# Patient Record
Sex: Female | Born: 1955 | Race: White | Hispanic: No | Marital: Married | Smoking: Current every day smoker
Health system: Southern US, Community
[De-identification: ages and names within clinical notes are randomized; demographics above are authoritative.]

## PROBLEM LIST (undated history)

## (undated) DIAGNOSIS — T7840XA Allergy, unspecified, initial encounter: Secondary | ICD-10-CM

## (undated) DIAGNOSIS — E785 Hyperlipidemia, unspecified: Secondary | ICD-10-CM

## (undated) DIAGNOSIS — G43909 Migraine, unspecified, not intractable, without status migrainosus: Secondary | ICD-10-CM

## (undated) DIAGNOSIS — F32A Depression, unspecified: Secondary | ICD-10-CM

## (undated) DIAGNOSIS — M549 Dorsalgia, unspecified: Secondary | ICD-10-CM

## (undated) DIAGNOSIS — F1010 Alcohol abuse, uncomplicated: Secondary | ICD-10-CM

## (undated) DIAGNOSIS — F329 Major depressive disorder, single episode, unspecified: Secondary | ICD-10-CM

## (undated) DIAGNOSIS — F339 Major depressive disorder, recurrent, unspecified: Secondary | ICD-10-CM

## (undated) DIAGNOSIS — F411 Generalized anxiety disorder: Secondary | ICD-10-CM

## (undated) HISTORY — DX: Hyperlipidemia, unspecified: E78.5

## (undated) HISTORY — DX: Depression, unspecified: F32.A

## (undated) HISTORY — PX: OTHER SURGICAL HISTORY: SHX169

## (undated) HISTORY — DX: Migraine, unspecified, not intractable, without status migrainosus: G43.909

## (undated) HISTORY — PX: TONSILLECTOMY AND ADENOIDECTOMY: SUR1326

## (undated) HISTORY — DX: Dorsalgia, unspecified: M54.9

## (undated) HISTORY — PX: LUMBAR DISC SURGERY: SHX700

## (undated) HISTORY — DX: Major depressive disorder, single episode, unspecified: F32.9

## (undated) HISTORY — DX: Alcohol abuse, uncomplicated: F10.10

## (undated) HISTORY — DX: Allergy, unspecified, initial encounter: T78.40XA

---

## 2006-04-18 ENCOUNTER — Ambulatory Visit (HOSPITAL_COMMUNITY): Admission: RE | Admit: 2006-04-18 | Discharge: 2006-04-18 | Payer: Self-pay | Admitting: Neurosurgery

## 2006-07-27 ENCOUNTER — Inpatient Hospital Stay (HOSPITAL_COMMUNITY): Admission: EM | Admit: 2006-07-27 | Discharge: 2006-07-28 | Payer: Self-pay | Admitting: Emergency Medicine

## 2006-07-27 ENCOUNTER — Encounter (INDEPENDENT_AMBULATORY_CARE_PROVIDER_SITE_OTHER): Payer: Self-pay | Admitting: Specialist

## 2006-11-19 HISTORY — PX: APPENDECTOMY: SHX54

## 2007-09-30 ENCOUNTER — Other Ambulatory Visit: Admission: RE | Admit: 2007-09-30 | Discharge: 2007-09-30 | Payer: Self-pay | Admitting: Family Medicine

## 2007-10-02 ENCOUNTER — Ambulatory Visit (HOSPITAL_COMMUNITY): Admission: RE | Admit: 2007-10-02 | Discharge: 2007-10-02 | Payer: Self-pay | Admitting: Family Medicine

## 2007-12-16 ENCOUNTER — Other Ambulatory Visit: Admission: RE | Admit: 2007-12-16 | Discharge: 2007-12-16 | Payer: Self-pay | Admitting: Family Medicine

## 2008-06-16 ENCOUNTER — Ambulatory Visit (HOSPITAL_COMMUNITY): Admission: RE | Admit: 2008-06-16 | Discharge: 2008-06-16 | Payer: Self-pay | Admitting: Neurosurgery

## 2009-05-16 ENCOUNTER — Inpatient Hospital Stay (HOSPITAL_COMMUNITY): Admission: RE | Admit: 2009-05-16 | Discharge: 2009-05-18 | Payer: Self-pay | Admitting: Neurosurgery

## 2009-11-19 HISTORY — PX: ANTERIOR FUSION CERVICAL SPINE: SUR626

## 2010-01-19 ENCOUNTER — Ambulatory Visit (HOSPITAL_COMMUNITY): Admission: RE | Admit: 2010-01-19 | Discharge: 2010-01-20 | Payer: Self-pay | Admitting: Neurosurgery

## 2010-05-07 ENCOUNTER — Emergency Department (HOSPITAL_COMMUNITY): Admission: EM | Admit: 2010-05-07 | Discharge: 2010-05-07 | Payer: Self-pay | Admitting: Emergency Medicine

## 2011-02-04 LAB — DIFFERENTIAL
Basophils Relative: 1 % (ref 0–1)
Lymphocytes Relative: 31 % (ref 12–46)
Monocytes Absolute: 0.4 10*3/uL (ref 0.1–1.0)
Neutrophils Relative %: 62 % (ref 43–77)

## 2011-02-04 LAB — CBC
Hemoglobin: 13.9 g/dL (ref 12.0–15.0)
Platelets: 142 10*3/uL — ABNORMAL LOW (ref 150–400)
RDW: 13.6 % (ref 11.5–15.5)

## 2011-02-04 LAB — BASIC METABOLIC PANEL
BUN: 8 mg/dL (ref 6–23)
Calcium: 9.6 mg/dL (ref 8.4–10.5)
Chloride: 107 mEq/L (ref 96–112)
Creatinine, Ser: 0.68 mg/dL (ref 0.4–1.2)
GFR calc Af Amer: >60 mL/min (ref >60)
Glucose, Bld: 93 mg/dL (ref 70–99)
Potassium: 4 mEq/L (ref 3.5–5.1)

## 2011-02-05 ENCOUNTER — Other Ambulatory Visit (HOSPITAL_COMMUNITY): Payer: Self-pay | Admitting: Neurosurgery

## 2011-02-05 DIAGNOSIS — M542 Cervicalgia: Secondary | ICD-10-CM

## 2011-02-05 DIAGNOSIS — M5416 Radiculopathy, lumbar region: Secondary | ICD-10-CM

## 2011-02-07 LAB — CBC
HCT: 44.1 % (ref 36.0–46.0)
MCHC: 34 g/dL (ref 30.0–36.0)
Platelets: 126 10*3/uL — ABNORMAL LOW (ref 150–400)
RBC: 4.65 MIL/uL (ref 3.87–5.11)
WBC: 7.5 10*3/uL (ref 4.0–10.5)

## 2011-02-07 LAB — SURGICAL PCR SCREEN: MRSA, PCR: NEGATIVE

## 2011-02-13 ENCOUNTER — Ambulatory Visit (HOSPITAL_COMMUNITY)
Admission: RE | Admit: 2011-02-13 | Discharge: 2011-02-13 | Disposition: A | Discharge status: Home / Self Care | Payer: 59 | Source: Ambulatory Visit | Attending: Neurosurgery | Admitting: Neurosurgery

## 2011-02-13 ENCOUNTER — Other Ambulatory Visit (HOSPITAL_COMMUNITY): Payer: Self-pay

## 2011-02-13 DIAGNOSIS — M542 Cervicalgia: Secondary | ICD-10-CM

## 2011-02-13 DIAGNOSIS — M5416 Radiculopathy, lumbar region: Secondary | ICD-10-CM

## 2011-02-13 DIAGNOSIS — IMO0002 Reserved for concepts with insufficient information to code with codable children: Secondary | ICD-10-CM | POA: Insufficient documentation

## 2011-02-13 DIAGNOSIS — Z981 Arthrodesis status: Secondary | ICD-10-CM | POA: Insufficient documentation

## 2011-02-13 DIAGNOSIS — M25559 Pain in unspecified hip: Secondary | ICD-10-CM | POA: Insufficient documentation

## 2011-02-13 DIAGNOSIS — R209 Unspecified disturbances of skin sensation: Secondary | ICD-10-CM | POA: Insufficient documentation

## 2011-02-13 DIAGNOSIS — R51 Headache: Secondary | ICD-10-CM | POA: Insufficient documentation

## 2011-02-13 MED ORDER — IOHEXOL 300 MG/ML  SOLN
10.0000 mL | Freq: Once | INTRAMUSCULAR | Status: AC | PRN
  Administered 2011-02-13: 10 mL via INTRATHECAL

## 2011-02-26 LAB — BASIC METABOLIC PANEL
CO2: 28 mEq/L (ref 19–32)
Chloride: 107 mEq/L (ref 96–112)
GFR calc Af Amer: >60 mL/min (ref >60)
Potassium: 3.5 mEq/L (ref 3.5–5.1)

## 2011-02-26 LAB — CBC
HCT: 36.1 % (ref 36.0–46.0)
Hemoglobin: 13 g/dL (ref 12.0–15.0)
Hemoglobin: 14 g/dL (ref 12.0–15.0)
MCV: 94.6 fL (ref 78.0–100.0)
Platelets: 167 10*3/uL (ref 150–400)
RBC: 3.82 MIL/uL — ABNORMAL LOW (ref 3.87–5.11)
RBC: 4.33 MIL/uL (ref 3.87–5.11)
RDW: 13.6 % (ref 11.5–15.5)
WBC: 6.7 10*3/uL (ref 4.0–10.5)
WBC: 9.3 10*3/uL (ref 4.0–10.5)

## 2011-02-26 LAB — TYPE AND SCREEN: ABO/RH(D): A POS

## 2011-04-03 NOTE — Op Note (Signed)
NAMELANEYA, GASAWAY NO.:  [id number]   MEDICAL RECORD NO.:  [id number]          PATIENT TYPE:  OIB   LOCATION:  3526                         FACILITY:  [facility]   PHYSICIAN:  Cristi Loron, M.D.DATE OF BIRTH:  1956/04/28   DATE OF PROCEDURE:  06/16/2008  DATE OF DISCHARGE:  06/16/2008                               OPERATIVE REPORT   BRIEF HISTORY:  The patient is a 51-year white female who I performed a  left L5-S1 diskectomy back in 2007.  The patient did very well, never  seen hr in a couple of years.  She unfortunately developed recurrent  back and leg pain and now she is worked up with a lumbar MRI which  demonstrated a recurrent herniated disk L5-S1 on the left.  The patient  failed nonsurgical management and therefore weighed the risks, benefits,  and alternatives of surgery and decided to proceed with a left L5-S1  redo diskectomy.   PREOPERATIVE DIAGNOSES:  Left L5-S1 recurrent herniated stenosis,  degenerative disease, lumbar radiculopathy such as myelopathy and  lumbago.   POSTOPERATIVE DIAGNOSES:  Left L5-S1 recurrent herniated stenosis,  degenerative disease, lumbar radiculopathy such as myelopathy and  lumbago.   PROCEDURE:  Redo left L5-S1 diskectomy using microdissection.   SURGEON:  Cristi Loron, MD   ASSISTANT:  Danae Orleans. Venetia Maxon, MD   ANESTHESIA:  General endotracheal.   ESTIMATED BLOOD LOSS:  50 mL.   SPECIMENS:  None.   DRAINS:  None.   COMPLICATIONS:  None.   DESCRIPTION OF PROCEDURE:  The patient was brought to the operating room  by the anesthesia team.  General endotracheal anesthesia was induced.  The patient was then turned to the prone position on the Wilson frame.  Her lumbosacral region was then prepared with Betadine scrub and  Betadine solution.  Sterile drapes were applied.  I then injected the  area to be incised with Marcaine with epinephrine solution and used a  scalpel to make a linear midline incision  over the L5-S1 interspace  through the patient's pre-existing surgical scar.  I then used  electrocautery to perform a left-sided subperiosteal dissection exposing  the left spinous process and lamina of L5 and upper sacrum.  We obtained  intraoperative radiograph to confirm our location.   I then inserted the McCullough retractor for exposure and we brought the  operative microscope into the field, and under its electromagnification  and illumination, we completed the microdissection/decompression.  I  used a high-speed drill to extend the patient's prior left L5 laminotomy  in a cephalad direction until I encountered some relatively non-swaged  down dura.  I then used microdissection to free up the thecal sac from  the epidural fibrosis.  We used a Kerrison punch to remove some of the  epidural fibrosis.  We identified the left S1 nerve root and performed a  foraminotomy about it.  I then used microdissection to free up the nerve  root and thecal sac from the underlying disk herniation.  We encountered  free fragments of disk herniation emanating from the left L5-S1  interspace.  Dr. Venetia Maxon  then gently retracted the nerve roots and thecal  sac medially.  This exposed more disk herniation at the disk space.  We  incised the annulus fibrosis and performed a partial intervertebral  diskectomy using the pituitary forceps and the Epstein curette.  After  we were satisfied with the intervertebral diskectomy, we used Acufex  tool to remove some posterior spondylosis and redundant posterior  longitudinal ligament from the endplates at L5-S1 further decompressing  the neural structures.  We then palpated along the ventral surface of  the thecal sac along the exit route of the left S1 nerve root and noted  that neural structure were well decompressed.  We obtained hemostasis  using bipolar electrocautery.  We irrigated the wound out with  bacitracin solution, removed the retractor, and then  reapproximated the  patient's thoracolumbar fascia with interrupted #1 Vicryl suture, the  subcutaneous tissue with interrupted 2-0 Vicryl suture, and skin with  Steri-Strips and Benzoin.  The wound was then coated with bacitracin  ointment.  A sterile dressing was applied.  The drapes were removed, and  the patient was subsequently returned to supine position where she was  extubated by the anesthesia team and transported to postanesthesia care  unit in stable condition.  All sponge, instrument, and needle counts  were correct at the end of this case.      Cristi Loron, M.D.  Electronically Signed     JDJ/MEDQ  D:  06/16/2008  T:  06/17/2008  Job:  [id number]

## 2011-04-03 NOTE — Op Note (Signed)
NAMECAIRO, LINGENFELTER NO.:  [id number]   MEDICAL RECORD NO.:  [id number]          PATIENT TYPE:  INP   LOCATION:  3004                         FACILITY:  [facility]   PHYSICIAN:  Cristi Loron, M.D.DATE OF BIRTH:  11-23-1955   DATE OF PROCEDURE:  05/16/2009  DATE OF DISCHARGE:                               OPERATIVE REPORT   BRIEF HISTORY:  The patient is a 55 year old white female who suffered 2  prior ruptured disk at L5-S1 and has undergone 2 diskectomies.  The  patient has developed recurrent back and left leg pain consistent with a  left S1 radiculopathy.  He had a work up of the lumbar MRI, which  demonstrated the patient to have another ruptured disk at L5-S1 on the  left.  We discussed the various treatment options including surgery.  The patient has weighed the risks, benefits, and alternatives of the  surgery and decided to proceed with a L5-S1 decompression,  instrumentation, and fusion.   PREOPERATIVE DIAGNOSES:  1. Recurrent L5-S1 herniated nucleus pulposus.  2. Degenerative disk disease.  3. Foraminal stenosis.   POSTOPERATIVE DIAGNOSES:  1. Recurrent L5-S1 herniated nucleus pulposus.  2. Degenerative disk disease.  3. Foraminal stenosis.   PROCEDURE:  Redo left L5 laminotomy to decompress the left L5 as well as  the left S1 nerve root (work was in addition if the work required due to  the interbody fusion because the patient's recurrent ruptured disk and  severe foraminal stenosis), L5-S1 transforaminal lumbar interbody fusion  with local morselized autograft bone and Actifuse bone graft extender;  insertion of L5-S1 interbody prosthesis (Capstone PEEK interbody  prosthesis); posterior nonsegmental instrumentation at L5-S1 with legacy  titanium pedicle screws and rods; L5-S1 posterolateral arthrodesis with  local morselized autograft bone and Vitoss bone graft extender.   SURGEON:  Cristi Loron, MD   ASSISTANT:  Danae Orleans. Venetia Maxon,  MD   ANESTHESIA:  General endotracheal.   ESTIMATED BLOOD LOSS:  200 mL.   SPECIMENS:  None.   DRAINS:  None.   COMPLICATIONS:  None.   DESCRIPTION OF PROCEDURE:  The patient was brought to the operating room  by the anesthesia team.  General endotracheal anesthesia was induced.  The patient was carefully turned into the prone position on Wilson  frame.  Her lumbosacral region was then prepared with Betadine, scrubbed  with Betadine solution.  Sterile drapes were applied.  I then injected  the area to be incised with Marcaine with epinephrine solution.  Used  scalpel to make a linear midline incision through the patient's previous  surgical scar at L5-S1.  We extended the incision in the cephalad and  caudal direction.  We used electrocautery performing bilateral  subperiosteal dissection exposing spinous process of lamina of L4-5 in  the upper sacrum.  We obtained intraoperative radiograph to confirm our  location and inserted the first retractor for exposure.   Because of the patient's recurrent ruptured disk and severe degeneration  at foraminal stenosis a wide lateral decompression was required on the  left to decompress the left L5 as well as S1 nerve root.  We began the  decompression by use of a high-speed drill to extend the patient's prior  L5 laminotomy in cephalad direction.  We drilled until we encountered  some relatively nonscarred dura.  We then carefully dissected through  the epidural fibrosis exposing underlying dura.  We widened laminotomy  with Kerrison punch and then performed wide foraminotomies about the  left L5-S1 nerve root completing almost completed facetectomy at L5-S1  on left.  We then carefully freed up the thecal sac and the S1 nerve  root from the scar tissue and then retracted it medially with D'Errico  retractor.  This exposed the recurrent ruptured disk.  We removed it in  multiple fragments using pituitary forceps.  We then incised into the   intervertebra.  This completed the decompression.   We incised into the L5-S1 intervertebral disk with 15-blade scalpel  performed a partial intervertebral section with a pituitary forceps.  The disk space was quite collapsed.  We removed degenerated fragments  off intervertebral disk.  We then prepared the vertebral endplates for  fusion by using curettes to remove the soft tissue from the retrieved  plates at G3-O7.  We then used trial spacers determined to use a 10 x 20  x 32 mm Capstone Peak interbody prosthesis.  We prefilled this  prosthesis with combination of local morselized autograft bone we  obtained on decompression as well as Actifuse bone graft extender.  We  then retracted the thecal sac and S1 nerve root medially inserted  prosthesis in the L5-S1 interspace.  We then used a tamp to turn it  laterally.  We got a good snug fit of the prosthesis interspace.  We  filled the remainder of disk space with a combination of local  morselized autograft bone and Actifuse bone graft extender.  This  completed the transforaminal lumbar interbody fusion.   We now turned attention to the instrumentation under fluoroscopic  guidance.  We cannulated bilateral L5-S1 pedicles with bone probe.  We  tapped the pedicles with a 5.5 mm tap.  We then removed the tap and  probed inside the tapped pedicles throughout cortical breeches.  We then  inserted 6.5 x 45 mm pedicle screws bilaterally at L5 and 6.5 x 40  bilaterally at S1.  We got good bony purchase.  We then connected  unilateral pedicle screws with a lordotic rod.  We compressed construct  and then secured the rod in place with the caps, which we tightened  appropriately completing the instrumentation.   We now turned attention to posterolateral arthrodesis.  We used high-  speed drill to corticate the remainder of the L5-S1 pars facet and  transverse processes.  We then laid a combination of local morselized  autograft bone and Vitoss  bone graft extender over these decorticated  posterior structures completing the post lateral arthrodesis.   We then inspected the thecal sac on the left at L5-S1 as well as left L5-  S1 nerve root and noted neural structure well decompressed.  We obtained  hemostasis using bipolar electrocautery irrigated the wound out with  bacitracin solution.  We then removed the retractors and then  reapproximated the patient's thoracolumbar fascia with interrupted #1  Vicryl suture, subcutaneous with 2-0 Vicryl suture and the skin with  Steri-Strips and Benzoin.  The  wound was then coated with bacitracin ointment.  Sterile dressing  applied.  The drapes were removed.  The patient was returned to supine  position where she was extubated by the  anesthesia team and transported  to post anesthesia care unit in stable condition.  All sponge,  instrument, and needle counts were correct at the end of the case.      Cristi Loron, M.D.  Electronically Signed     JDJ/MEDQ  D:  05/16/2009  T:  05/17/2009  Job:  [id number]

## 2011-04-06 NOTE — Op Note (Signed)
NAMEJODELL, WEITMAN NO.:  [id number]   MEDICAL RECORD NO.:  [id number]          PATIENT TYPE:  AMB   LOCATION:  SDS                          FACILITY:  [facility]   PHYSICIAN:  Cristi Loron, M.D.DATE OF BIRTH:  27-Jul-1956   DATE OF PROCEDURE:  04/18/2006  DATE OF DISCHARGE:                                 OPERATIVE REPORT   BRIEF HISTORY:  The patient is 55 year old white female who has suffered  from back and left leg pain consistent with left S1 radiculopathy.  She  failed medical management was worked up with a lumbar MRI which demonstrated  a large herniated disk L5-S1 the left.  I discussed the various treatment  options with her including surgery.  The patient weighed the risks, benefits  and alternatives of surgery and decided to proceed with left L5-S1  microdiskectomy.   PREOPERATIVE DIAGNOSIS:  Left L5-S1 herniated nucleus pulposus, spinal  stenosis, lumbar radiculopathy, lumbago.   POSTOPERATIVE DIAGNOSIS:  Left L5-S1 herniated nucleus pulposus, spinal  stenosis, lumbar radiculopathy, lumbago.   PROCEDURE:  Left L5-S1 microdiskectomy using microdissection.   SURGEON:  Cristi Loron, M.D.   ASSISTANT:  Hilda Lias, M.D.   ANESTHESIA:  General.   ESTIMATED BLOOD LOSS:  50 mL.   SPECIMENS:  None.   DRAINS:  None.   COMPLICATIONS:  None.   DESCRIPTION OF PROCEDURE:  The patient was brought to the operating room by  anesthesia team.  General endotracheal anesthesia was induced.  The patient  was then turned to the prone position on the Wilson frame.  Her lumbosacral  region was then prepared with Betadine scrub and Betadine solution.  Sterile  drapes were applied.  I then injected the area to be incised with Marcaine  with epinephrine solution, used a scalpel to make a linear midline incision  over the L5-S1 interspace.  I used electrocautery to perform a left-sided  subperiosteal dissection exposing the left spinous process and  lamina of L5  and the upper sacrum.  I then obtained an intraoperative radiograph to  confirm our location.   I then inserted the McCullough retractor for exposure and then brought the  operating microscope into the field.  Under the microscopes magnification  and illumination, completed microdissection/decompression.  I used high  speed drill to perform a left L5 laminotomy.  I widened the laminotomy with  Kerrison punches removing the L5-S1 ligamentum flavum.  I then performed a  foraminotomy about the left S1 nerve root.  I then used microdissection to  free up the nerve root from the epidural tissue and Dr. Jeral Fruit gently  retracted the nerve root medially with the D'Errico retractor.  This exposed  the underlying disk herniation with removed in multiple fragments using a  pituitary forceps.  I then inspected the L5-S1 intervertebral disk. There  was a hole in the annulus but there did not appear to be impending  herniations.  I therefore did not go into the intervertebral disk space.  I  then palpated along the ventral surface of the thecal sac, along the exit  route of the  left S1 nerve root and noted the neural structures well  decompressed.  I obtained stringent hemostasis using bipolar electrocautery.  I irrigated the wound out with bacitracin solution, then removed the  McCullough retractor.  I then reapproximated the patient's thoracolumbar  fascia with interrupted #1 Vicryl suture, the subcutaneous tissue with  interrupted 2-0 Vicryl suture and the skin with Steri-Strips and benzoin.  The wound was then coated with bacitracin ointment.  Sterile dressing  applied.  The drapes were removed.  The patient was subsequently turned to  supine position where she was extubated by anesthesia team and transported  to post anesthesia care unit in stable condition.  All sponge, needle and  instrument counts were correct at end of this case.      Cristi Loron, M.D.  Electronically  Signed     JDJ/MEDQ  D:  04/18/2006  T:  04/18/2006  Job:  [id number]

## 2011-04-06 NOTE — Op Note (Signed)
[name], Roach                ACCOUNT NO.:  [id number]   MEDICAL RECORD NO.:  [id number]          PATIENT TYPE:  INP   LOCATION:  1510                         FACILITY:  [facility]   PHYSICIAN:  Wilmon Arms. Corliss Skains, M.D. DATE OF BIRTH:  [birth year]   DATE OF PROCEDURE:  07/27/2006  DATE OF DISCHARGE:  07/28/2006                                 OPERATIVE REPORT   PREOPERATIVE DIAGNOSIS:  Acute appendicitis.   POSTOPERATIVE DIAGNOSIS:  Acute appendicitis.   PROCEDURE PERFORMED:  Laparoscopic appendectomy.   SURGEON:  Wilmon Arms. Tsuei, M.D.   ANESTHESIA:  General endotracheal.   INDICATIONS:  The patient is a 55 year old female who is in good health who  presented with 2 days of periumbilical pain migrating to her right lower  quadrant.  She presented to the emergency department where a CT scan  revealed evidence of acute appendicitis.  Surgery was consulted and we  recommended urgent laparoscopic appendectomy.  She was given preoperative  antibiotics and was brought to the operating room.   DESCRIPTION OF PROCEDURE:  The patient was brought to the operating room and  placed in the supine position on the operating room table.  After an  adequate level of general anesthesia was obtained, a Foley catheter was  placed under sterile technique.  The patient's abdomen was then prepped with  Betadine and draped in a sterile fashion.  A timeout was taken assure the  proper patient and proper procedure.  Her umbilicus was infiltrated with  0.25% Marcaine.  A vertical incision was made just below her umbilicus.  Dissection was carried down to the fascia, which was opened vertically.  The  peritoneal cavity was entered.  A pursestring suture of 0 Vicryl was placed  around the fascial opening.  The Hasson cannula was inserted and secured  with a stay suture.  Pneumoperitoneum was obtained by insufflating CO2 and  maintaining maximal pressure of 15 mmHg.  The laparoscope was inserted and  we  examined the right lower quadrant.  There were some chronic adhesions to  the anterior abdominal wall in the right lower quadrant.  The patient was  positioned in Trendelenburg position and tilted slightly to her left.  A 10  mm port was placed in the right upper quadrant.  A 5 mL port was placed in  the left lower quadrant.  The laparoscope was moved to the right upper  quadrant port site.  The adhesions were taken down with the harmonic  scalpel.  The cecum was mobilized medially and a mildly inflamed appendix  was identified.  Some inflammatory adhesions were taken down bluntly around  the base of the appendix.  The mesoappendix was then divided with the  harmonic scalpel.  The appendix then amputated with an Endo-GIA stapler.  The appendix was placed in an EndoCatch sac and removed through the  umbilical port site.  We then reinspected the right lower quadrant.  There  was a tiny oozing area just next to the staple line.  This was controlled  with pressure and small piece of  Surgicel.  We then irrigated the  pelvis.  No other bleeding or infection was noted.  The ports were removed under  direct vision as  pneumoperitoneum was released.  The fascial opening at the umbilicus was  closed with the pursestring suture.  4-0 Monocryl was used to close the  skin.  Steri-Strips and clean dressings were applied.  The patient was then  extubated and brought to the recovery in stable condition.  A Foley catheter  was removed prior to leaving the operating room.      Wilmon Arms. Tsuei, M.D.  Electronically Signed     MKT/MEDQ  D:  07/27/2006  T:  07/28/2006  Job:  [id number]   cc:   Alphonsus Sias  [address]  [place]  [place] [address]

## 2011-04-06 NOTE — H&P (Signed)
Leah Roach, Leah Roach                ACCOUNT NO.:  [id number]   MEDICAL RECORD NO.:  [id number]          PATIENT TYPE:  INP   LOCATION:  0104                         FACILITY:  [facility]   PHYSICIAN:  Wilmon Arms. Corliss Skains, M.D. DATE OF BIRTH:  03/13/1956   DATE OF ADMISSION:  07/27/2006  DATE OF DISCHARGE:                                HISTORY & PHYSICAL   CHIEF COMPLAINT:  Lower abdominal pain.   HISTORY OF PRESENT ILLNESS:  The patient is a 55 year old female in good  health who presents with a 2-day history of periumbilical abdominal pain,  constipation and decreased appetite.  She had one episode of vomiting 2 days  ago.  She denies any fever.  The pain had shifted more to her right side.  She presented to the emergency department, today, where the CT scan showed  evidence of acute appendicitis.   PAST MEDICAL HISTORY:  1. Former alcoholic.  2. Depression.   PAST SURGICAL HISTORY:  1. Lumbar diskectomy earlier in 2007.  2. A tonsillectomy as a child.   FAMILY HISTORY:  Several family members with gastric cancer.   SOCIAL HISTORY:  No alcohol use, but does smoke.   ALLERGIES:  None.   MEDICATIONS:  Paxil.   REVIEW OF SYSTEMS:  Otherwise negative.   PHYSICAL EXAMINATION:  VITAL SIGNS:  Afebrile, vital signs are stable.  GENERAL:  This is a well-developed, well-nourished female in no apparent  distress.  HEENT:  EOMI.  Sclerae anicteric.  NECK:  No masses, no thyromegaly.  LUNGS:  Clear to auscultation bilaterally.  Normal respiratory effort.  HEART:  Regular rate and rhythm.  No murmur.  ABDOMEN:  The patient has some periumbilical right lower quadrant  tenderness, nondistended.  No palpable masses.  No rebound, no guarding.  EXTREMITIES:  No edema.  SKIN: Warm and dry.  No sign of jaundice.   LABS:  White count 6.8, hemoglobin 12.7, platelet count 165.  Electrolytes  within normal limits.  Total bilirubin 0.4, lipase 24.  CT scan shows a  nonperforated inflamed  appendix.   IMPRESSION:  Acute appendicitis.   PLAN:  Recommend laparoscopic appendectomy.  We discussed the benefits and  risks of the procedure including the possible need for open procedure.  The  patient understands and wishes to proceed.      Wilmon Arms. Tsuei, M.D.  Electronically Signed     MKT/MEDQ  D:  07/27/2006  T:  07/27/2006  Job:  [id number]

## 2011-07-16 ENCOUNTER — Encounter: Payer: Self-pay | Admitting: Family Medicine

## 2011-07-16 ENCOUNTER — Ambulatory Visit (INDEPENDENT_AMBULATORY_CARE_PROVIDER_SITE_OTHER): Payer: 59 | Admitting: Family Medicine

## 2011-07-16 VITALS — BP 110/66 | HR 61 | Temp 98.1°F | Ht 66.0 in | Wt 125.0 lb

## 2011-07-16 DIAGNOSIS — M549 Dorsalgia, unspecified: Secondary | ICD-10-CM

## 2011-07-16 NOTE — Progress Notes (Signed)
Old records from pain clinic, neurosurg clinic, [facility], [facility] records and op notes reviewed.   New to est here.  Old h/o back pain s/p multiple surgeries.  Prev was disabled, is applying for long term status at this point.    Still with chronic pain in B shoulder girdle with weakness in hands, has been dropping objects unintentionally.  Chronic lower back pain, followed by pain clinic, s/p injection.  + h/o radicular sx. On chronic opiate therapy with partial relief of pain.   Pain exacerbated by certain movements, prolonged sitting.    PMH and SH reviewed  ROS: See HPI, otherwise noncontributory.  Meds, vitals, and allergies reviewed.   Nad, but uncomfortable Mmm ctab Dec in neck flex/ext/rotation, due to pain Upper T spine and lower C spine paraspinal muscles tender.  Upper L spine (at the midline) and lower L spine (paraspinal) muscles tender.  Weak grip x2, dec in sensation B 5th finger Symmetric weakness with arm abduction 3+ DTRs upper and patellar, dec in achilles B.  Dec in sensation L foot noted .

## 2011-07-16 NOTE — Patient Instructions (Signed)
I'll work on your papers and then we'll contact your.  Take care.

## 2011-07-17 ENCOUNTER — Encounter: Payer: Self-pay | Admitting: Family Medicine

## 2011-07-17 DIAGNOSIS — M549 Dorsalgia, unspecified: Secondary | ICD-10-CM | POA: Insufficient documentation

## 2011-07-17 NOTE — Assessment & Plan Note (Signed)
S/p diskectomy x2 with L and C spine fusion, followed by pain clinic.  Still with chronic pain requiring chronic opiate therapy.  It appears that she would be unable to work based on her current level of pain.  I will defer to pain clinic for mgmt of meds.  >60 min spent in total on case, ie with pt, reviewing records.

## 2011-08-17 LAB — BASIC METABOLIC PANEL
BUN: 15
CO2: 31
Chloride: 108
Creatinine, Ser: 0.7
Glucose, Bld: 88

## 2011-08-17 LAB — CBC
MCHC: 33.5
MCV: 93.7
Platelets: 162
RBC: 4.44
RDW: 13.1

## 2011-10-15 ENCOUNTER — Ambulatory Visit: Payer: 59 | Admitting: Family Medicine

## 2011-10-18 ENCOUNTER — Ambulatory Visit (INDEPENDENT_AMBULATORY_CARE_PROVIDER_SITE_OTHER): Payer: 59 | Admitting: Family Medicine

## 2011-10-18 ENCOUNTER — Encounter: Payer: Self-pay | Admitting: Family Medicine

## 2011-10-18 DIAGNOSIS — Z8249 Family history of ischemic heart disease and other diseases of the circulatory system: Secondary | ICD-10-CM

## 2011-10-18 DIAGNOSIS — R55 Syncope and collapse: Secondary | ICD-10-CM

## 2011-10-18 DIAGNOSIS — Z23 Encounter for immunization: Secondary | ICD-10-CM

## 2011-10-18 DIAGNOSIS — M549 Dorsalgia, unspecified: Secondary | ICD-10-CM

## 2011-10-18 DIAGNOSIS — R079 Chest pain, unspecified: Secondary | ICD-10-CM

## 2011-10-18 LAB — TSH: TSH: 1.08 u[IU]/mL (ref 0.35–5.50)

## 2011-10-18 LAB — CBC WITH DIFFERENTIAL/PLATELET
Basophils Absolute: 0 10*3/uL (ref 0.0–0.1)
HCT: 41.5 % (ref 36.0–46.0)
Hemoglobin: 13.9 g/dL (ref 12.0–15.0)
Lymphs Abs: 2 10*3/uL (ref 0.7–4.0)
Monocytes Relative: 3.7 % (ref 3.0–12.0)
Neutro Abs: 5.2 10*3/uL (ref 1.4–7.7)
RDW: 13.6 % (ref 11.5–14.6)

## 2011-10-18 LAB — COMPREHENSIVE METABOLIC PANEL
ALT: 24 U/L (ref 0–35)
CO2: 30 mEq/L (ref 19–32)
Creatinine, Ser: 0.8 mg/dL (ref 0.4–1.2)
GFR: 81.22 mL/min (ref >60.00)
Glucose, Bld: 93 mg/dL (ref 70–99)
Total Bilirubin: 0.3 mg/dL (ref 0.3–1.2)

## 2011-10-18 LAB — LIPID PANEL
Cholesterol: 253 mg/dL — ABNORMAL HIGH (ref 0–200)
Total CHOL/HDL Ratio: 3

## 2011-10-18 NOTE — Patient Instructions (Signed)
If you get profoundly short of breath, have more chest pain, or prolonged palpitations, then go to the ER.  See Shirlee Limerick about your referral before you leave today. You can get your results through our phone system.  Follow the instructions on the blue card. Take care.

## 2011-10-18 NOTE — Progress Notes (Signed)
Neck and back pain:  Constant "spider-web" feeling in the R shoulder and radicular pains on the medial>lateral side of L arm.  Still with radicular sx.  Numbness in L leg continues.  She's still dropping objects, her hands are "clumsy" at baseline.  Continued on pain meds through pain clinic.  She was seen at pain clinic 2 weeks ago (Dr. Vincent Peyer).  She's getting more positional numbness in her arms and hands.  She's going to see neurosurg in [place] soon.    She had an episode of syncope about 2 weeks ago.  She gets lightheaded occ.  Some occ chest heaviness and palpitations, can happen at rest, self resolves after about 1 minute.  No other syncope recently.  She did have a prev episode after a back surgery.  Fatigued and sweats occ noted.  No CP now.  Not SOB.   FH: Brother with CAD.    SH: Smoking 1 ppd.    Meds, vitals, and allergies reviewed.   ROS: See HPI.  Otherwise, noncontributory.  GEN: nad, alert and oriented HEENT: mucous membranes moist NECK:Dec in neck flex/ext/rotation, due to pain  Upper T spine and lower C spine paraspinal muscles tender.  Weak grip x2, as noted prev rrr ctab Ext w/o edema symm radial pulses  EGK reviewed.

## 2011-10-19 ENCOUNTER — Encounter: Payer: Self-pay | Admitting: Family Medicine

## 2011-10-19 DIAGNOSIS — R55 Syncope and collapse: Secondary | ICD-10-CM | POA: Insufficient documentation

## 2011-10-19 DIAGNOSIS — R079 Chest pain, unspecified: Secondary | ICD-10-CM | POA: Insufficient documentation

## 2011-10-19 LAB — LDL CHOLESTEROL, DIRECT: Direct LDL: 164.6 mg/dL

## 2011-10-19 NOTE — Assessment & Plan Note (Signed)
Per pain clinic with neurosurgery eval pending.

## 2011-10-19 NOTE — Assessment & Plan Note (Signed)
With her FH and smoking, I would like cards input.  Okay for outpatient f/u.  She requests [facility] cards.  D/w pt about stopping smoking. >25 min spent with face to face with patient, >50% counseling and/or coordinating care

## 2011-10-19 NOTE — Assessment & Plan Note (Signed)
Unclear source, but I would like cards input given her risk factors.  She agrees with plan.  See notes on labs when resulted.

## 2011-10-23 ENCOUNTER — Other Ambulatory Visit: Payer: Self-pay | Admitting: Family Medicine

## 2011-10-23 ENCOUNTER — Encounter: Payer: Self-pay | Admitting: Family Medicine

## 2011-10-23 DIAGNOSIS — F17200 Nicotine dependence, unspecified, uncomplicated: Secondary | ICD-10-CM | POA: Insufficient documentation

## 2011-10-23 DIAGNOSIS — IMO0001 Reserved for inherently not codable concepts without codable children: Secondary | ICD-10-CM | POA: Insufficient documentation

## 2011-10-23 DIAGNOSIS — E7800 Pure hypercholesterolemia, unspecified: Secondary | ICD-10-CM | POA: Insufficient documentation

## 2011-10-26 ENCOUNTER — Telehealth: Payer: Self-pay | Admitting: *Deleted

## 2011-10-26 ENCOUNTER — Other Ambulatory Visit: Payer: Self-pay | Admitting: *Deleted

## 2011-10-26 DIAGNOSIS — E7800 Pure hypercholesterolemia, unspecified: Secondary | ICD-10-CM

## 2011-10-26 MED ORDER — PRAVASTATIN SODIUM 20 MG PO TABS: 20.0000 mg | ORAL_TABLET | Freq: Every evening | ORAL | Status: DC

## 2011-10-26 NOTE — Telephone Encounter (Signed)
Patient scheduled 3 months follow up in February.  If she is able to tolerate the Pravastatin, she will need labs around that time.  She is requesting that we mail her an order for lab work and she can get it drawn at [facility] so that results will be in by her OV here.

## 2011-10-28 NOTE — Telephone Encounter (Signed)
The orders are in to be resulted through [facility].  Please mail her a copy of the lab order.  Let me know if that isn't going to work.  (this way it will show up in EMR as an order and not under the letters section).

## 2011-10-31 NOTE — Telephone Encounter (Signed)
Please write an order to be sent to the Patient. Not able to print the order in the computer without arriving it as being done today, Leah Roach

## 2011-11-02 NOTE — Telephone Encounter (Signed)
rx for cmet and lipid done, please give to pt.  Labs due ~12/21/11.  Orders in EMR removed. Thanks.

## 2011-11-02 NOTE — Telephone Encounter (Signed)
Order mailed to patient.

## 2012-01-02 ENCOUNTER — Encounter: Payer: Self-pay | Admitting: Family Medicine

## 2012-01-15 ENCOUNTER — Ambulatory Visit: Payer: 59 | Admitting: Family Medicine

## 2012-01-23 ENCOUNTER — Ambulatory Visit: Payer: 59 | Admitting: Family Medicine

## 2012-01-30 ENCOUNTER — Encounter: Payer: Self-pay | Admitting: Family Medicine

## 2012-01-30 ENCOUNTER — Ambulatory Visit (INDEPENDENT_AMBULATORY_CARE_PROVIDER_SITE_OTHER): Payer: 59 | Admitting: Family Medicine

## 2012-01-30 VITALS — BP 110/70 | HR 60 | Temp 98.3°F | Wt 129.2 lb

## 2012-01-30 DIAGNOSIS — F17200 Nicotine dependence, unspecified, uncomplicated: Secondary | ICD-10-CM

## 2012-01-30 DIAGNOSIS — E7800 Pure hypercholesterolemia, unspecified: Secondary | ICD-10-CM

## 2012-01-30 DIAGNOSIS — M549 Dorsalgia, unspecified: Secondary | ICD-10-CM

## 2012-01-30 DIAGNOSIS — F329 Major depressive disorder, single episode, unspecified: Secondary | ICD-10-CM

## 2012-01-30 NOTE — Patient Instructions (Signed)
Take care.  Don't change your meds and good luck with the smoking.

## 2012-01-30 NOTE — Progress Notes (Signed)
She's getting ready to have surgery/fusion at [facility] for C spine disease and then several months later for lumbar stenosis.  Persistent paresthesia in arms and legs, along with pain.    She had an episode of syncope prev and her eval at [facility] was rescheduled until she cancelled and then scheduled with another clinic, eval is pending.  No more palpitations, they gradually decreased, no other syncope.  She'll occ get light headed but isn't severe.  The syncope was around the time of a new med start for pain, and this episode could have been med related.    Her daughter continues to have anxiety and this is a source of stress.  Continues on prev meds.  Abstaining from etoh .  HLD: She was started on statin in meantime.  Lipids now at goal.   Using medications without problems:yes Muscle aches:  At baseline, not worse with meds Diet compliance:yes Exercise:limited by pain.  Other complaints: labs d/w pt.    Down to 1/2 PPD smoking.    PMH and SH reviewed  Meds, vitals, and allergies reviewed.   ROS: See HPI.  Otherwise negative.    GEN: nad, alert and oriented HEENT: mucous membranes moist NECK: supple w/o LA CV: rrr. PULM: ctab, no inc wob ABD: soft, +bs EXT: no edema

## 2012-01-31 ENCOUNTER — Encounter: Payer: Self-pay | Admitting: Family Medicine

## 2012-01-31 DIAGNOSIS — F32A Depression, unspecified: Secondary | ICD-10-CM | POA: Insufficient documentation

## 2012-01-31 DIAGNOSIS — F329 Major depressive disorder, single episode, unspecified: Secondary | ICD-10-CM | POA: Insufficient documentation

## 2012-01-31 NOTE — Assessment & Plan Note (Signed)
She's cutting back, planning on stopping.

## 2012-01-31 NOTE — Assessment & Plan Note (Signed)
With surgery pending at [facility]

## 2012-01-31 NOTE — Assessment & Plan Note (Signed)
Continue current meds 

## 2012-01-31 NOTE — Assessment & Plan Note (Signed)
Improved, continue current meds.  No ADE.

## 2012-04-07 ENCOUNTER — Telehealth: Payer: Self-pay | Admitting: Family Medicine

## 2012-04-07 NOTE — Telephone Encounter (Signed)
Case manager, Corrie Dandy, with [employer] called and said she had received the medical records from [place] for 3/13 and has information that patient is scheduled for next visit on 04/21/12 and would like to know if Dr. Para March is keeping the patient out of work.  Please return call to [phone]

## 2012-04-08 NOTE — Telephone Encounter (Signed)
Call them back.  Based on prev exams, it would appear that patient hasn't been a candidate to return to work.  At last OV, she was in the process of arranging neck surgery.

## 2012-04-08 NOTE — Telephone Encounter (Signed)
LMOVM (secure).

## 2012-04-21 ENCOUNTER — Ambulatory Visit: Payer: 59 | Admitting: Family Medicine

## 2012-04-28 ENCOUNTER — Ambulatory Visit: Payer: 59 | Admitting: Family Medicine

## 2012-05-02 ENCOUNTER — Ambulatory Visit: Payer: 59 | Admitting: Family Medicine

## 2012-05-12 ENCOUNTER — Ambulatory Visit (INDEPENDENT_AMBULATORY_CARE_PROVIDER_SITE_OTHER): Payer: 59 | Admitting: Family Medicine

## 2012-05-12 ENCOUNTER — Encounter: Payer: Self-pay | Admitting: Family Medicine

## 2012-05-12 VITALS — BP 118/80 | HR 75 | Temp 98.2°F | Wt 133.0 lb

## 2012-05-12 DIAGNOSIS — F17200 Nicotine dependence, unspecified, uncomplicated: Secondary | ICD-10-CM

## 2012-05-12 DIAGNOSIS — F329 Major depressive disorder, single episode, unspecified: Secondary | ICD-10-CM

## 2012-05-12 DIAGNOSIS — M549 Dorsalgia, unspecified: Secondary | ICD-10-CM

## 2012-05-12 DIAGNOSIS — E7800 Pure hypercholesterolemia, unspecified: Secondary | ICD-10-CM

## 2012-05-12 DIAGNOSIS — F3289 Other specified depressive episodes: Secondary | ICD-10-CM

## 2012-05-12 NOTE — Patient Instructions (Addendum)
Don't change your meds for now.   I would recheck in 2/14, sooner if needed.  Take care.  Good luck with the back surgery.  Recheck labs in 2/14 before the visit.

## 2012-05-12 NOTE — Assessment & Plan Note (Signed)
Discussed, she's working on quitting.

## 2012-05-12 NOTE — Assessment & Plan Note (Signed)
With f/u L spine surgery pending at [facility].  I will defer to surgery/pain clinic.

## 2012-05-12 NOTE — Assessment & Plan Note (Signed)
Continue current meds.  Stable mood.

## 2012-05-12 NOTE — Progress Notes (Signed)
S/p cervical spine fusion in 3/13.  Still with "a spider web feeling" in the R shoulder but pain is some better. She still has some weakness in the L hand.  She's dropped objects with L hand due to weakness. Continues on baseline pain meds.  Continued with lumbar spine pain, has surgery pending at [facility] in 05/2012.  Still with R >L leg radicular pain and numbness in L foot.  She isn't driving because her hands and feet will go numb.  Her disability has been approved through August.    "How is your mood?"  "Frustrated."  Her activities have been limited and this is tough for her.  She continues on SSRI with some help.  Sleep is okay.  She occ wakes at night from her back, with rolling over.  This affects her energy level during the day.    Smoking.  Discussed.  Was was temporarily off cigs and used IS after the surgery.  Down to less than 1/2 PPD now.  She's trying to quit.    Her daughter is showing some improvement in terms of her relationships with family.    Meds, vitals, and allergies reviewed.   ROS: See HPI.  Otherwise, noncontributory.  nad ncat Mmm rrr ctab Back w/o midline pain but L grip weak than R, L hip flexor weaker than R, and L foot with weaker dorsiflexion Speech and affect wnl

## 2012-05-12 NOTE — Assessment & Plan Note (Signed)
On statin.  Hand written order for cmet/lipids before visit in 2014.  [facility].

## 2012-10-27 ENCOUNTER — Other Ambulatory Visit: Payer: Self-pay | Admitting: Family Medicine

## 2013-01-12 IMAGING — RF DG MYELOGRAM 2+ REGIONS
12 of 19 series · 12 of 19 positions shown · non-contrast
Comparison: Intraoperative cervical spine lateral view 01/19/2010.

CLINICAL DATA: Low back pain.  Bilateral hip pain.  Leg pain
greater on the right.  Numbness left foot.  Mid posterior neck
pain.  Headaches.  Right shoulder and upper back tingling.

CERVICAL AND LUMBAR MYELOGRAM AND POST MYELOGRAM CT
MYELOGRAM CERVICAL
TECHNIQUE: Contrast was injected by Dr.Pinna the L3-4  level.
Contrast was negotiated into the cervical spine without difficulty.
Fluoroscopy time:  0.56 minutes
TECHNIQUE: Multidetector CT imaging of the cervical spine was
performed following myelography.  Multiplanar CT image
reconstructions were also generated.
TECHNIQUE: Multidetector CT imaging of the lumbar spine was

[Series 1: run · 1 of 1 slices shown (1 of 12)]
[im 1/1]
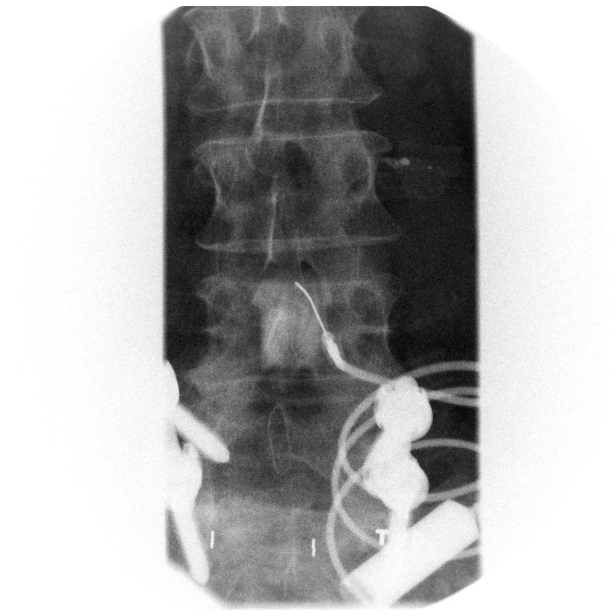

[Series 3: run · 1 of 1 slices shown (2 of 12)]
[im 1/1]
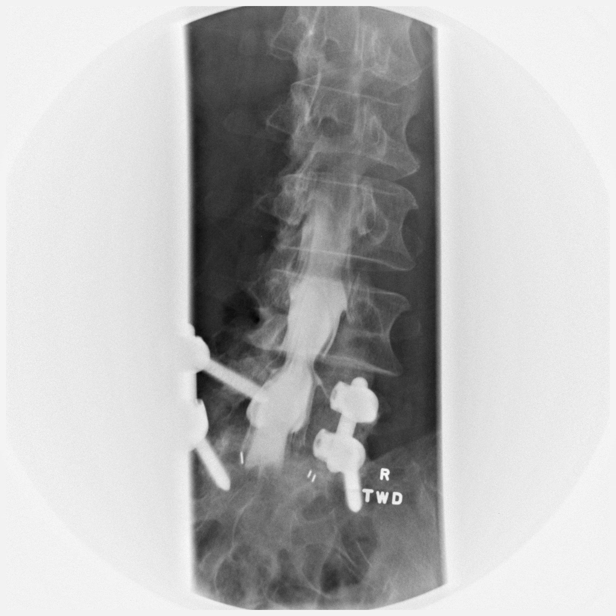

[Series 4: run · 1 of 1 slices shown (3 of 12)]
[im 1/1]
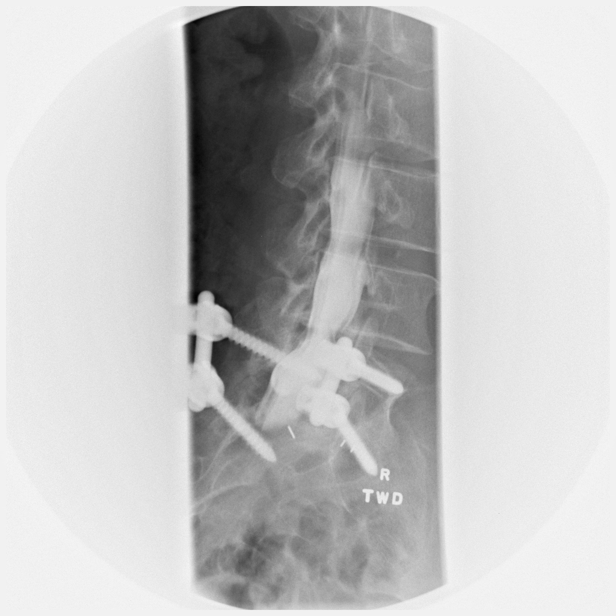

[Series 6: run · 1 of 1 slices shown (4 of 12)]
[im 1/1]
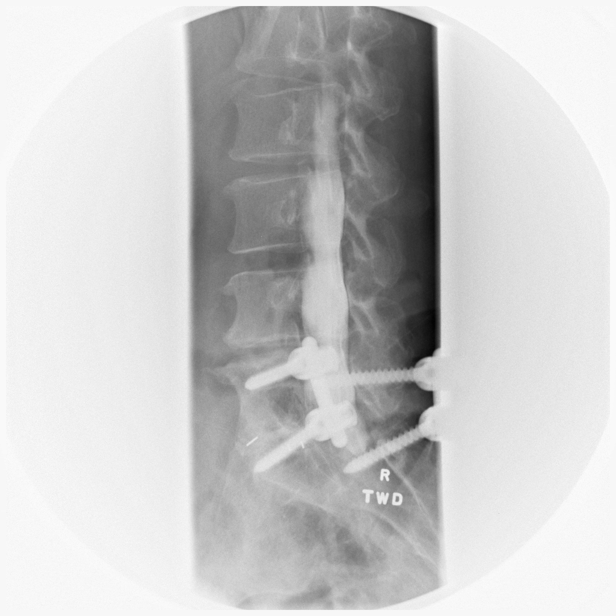

[Series 8: run · 1 of 1 slices shown (5 of 12)]
[im 1/1]
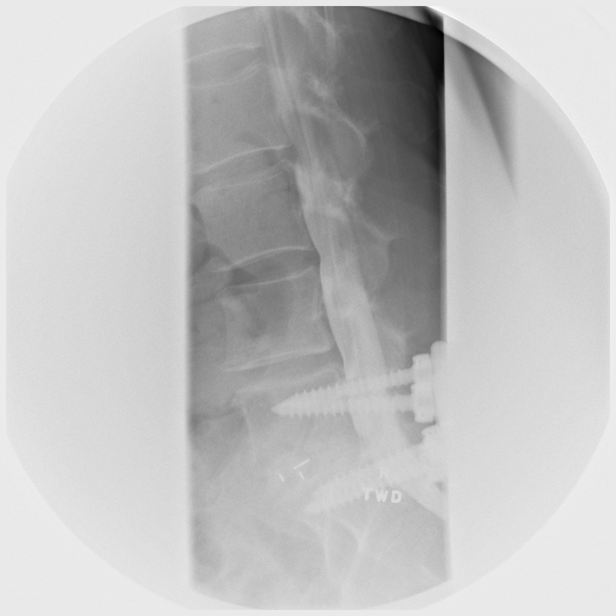

[Series 9: run · 1 of 1 slices shown (6 of 12)]
[im 1/1]
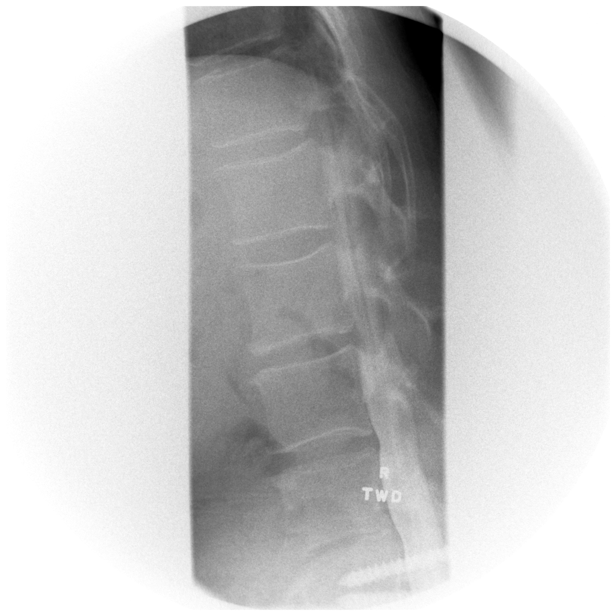

[Series 11: run · 1 of 1 slices shown (7 of 12)]
[im 1/1]
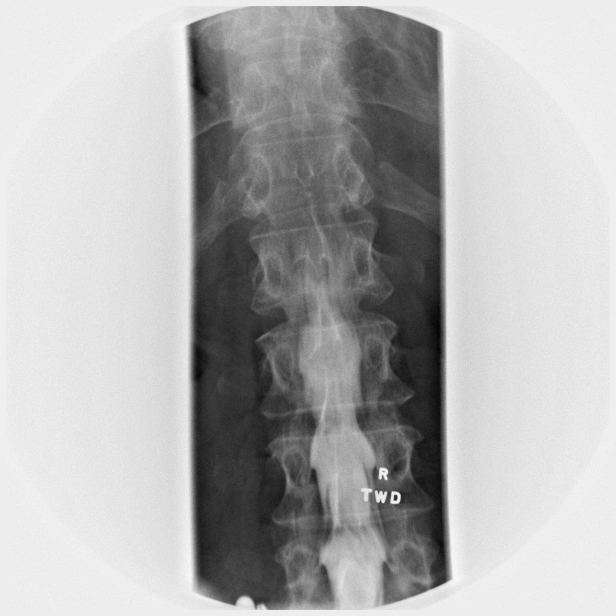

[Series 12: run · 1 of 1 slices shown (8 of 12)]
[im 1/1]
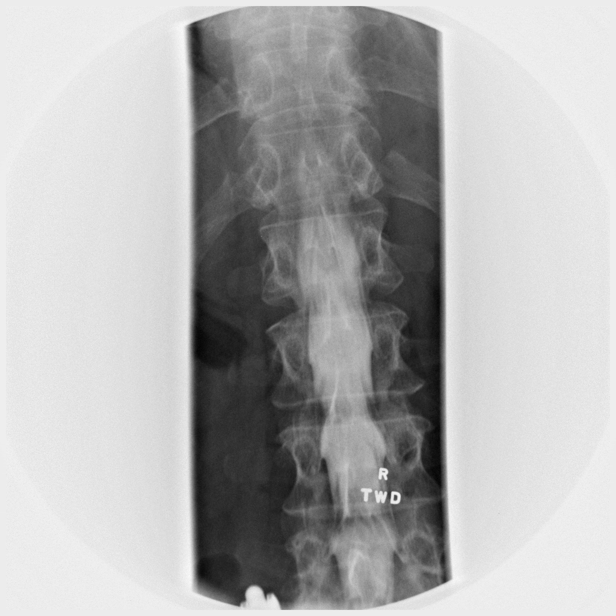

[Series 14: run · 1 of 1 slices shown (9 of 12)]
[im 1/1]
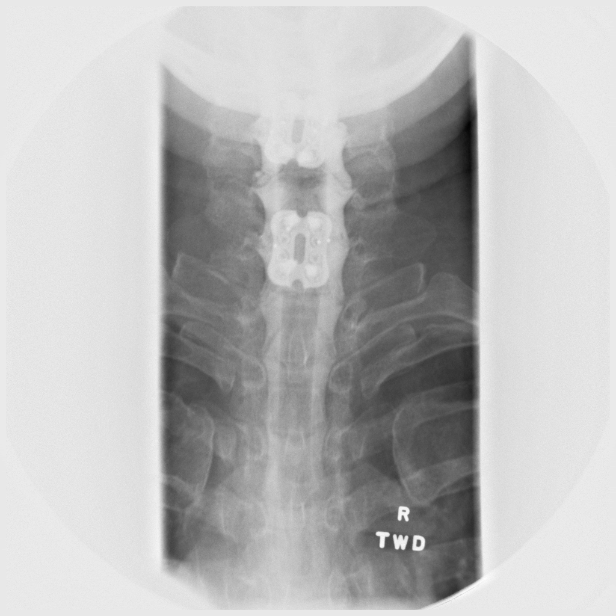

[Series 16: run · 1 of 1 slices shown (10 of 12)]
[im 1/1]
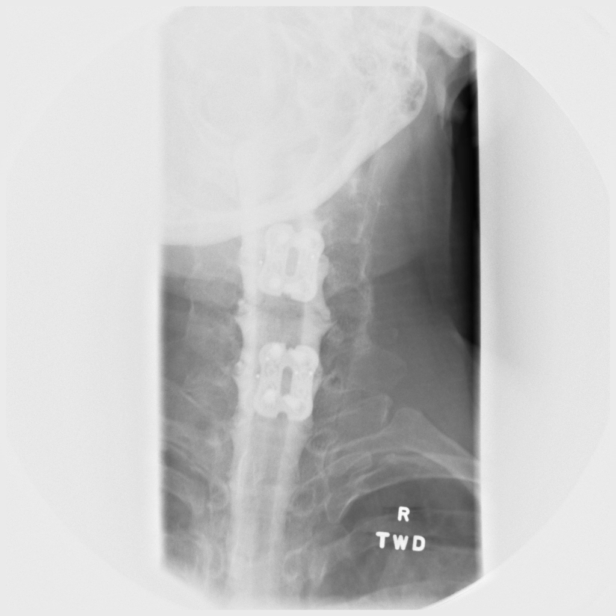

[Series 17: run · 1 of 1 slices shown (11 of 12)]
[im 1/1]
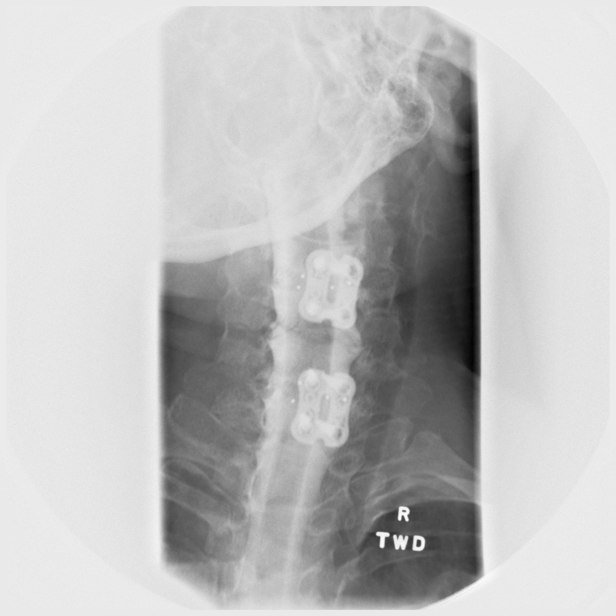

[Series 19: run · 1 of 1 slices shown (12 of 12)]
[im 1/1]
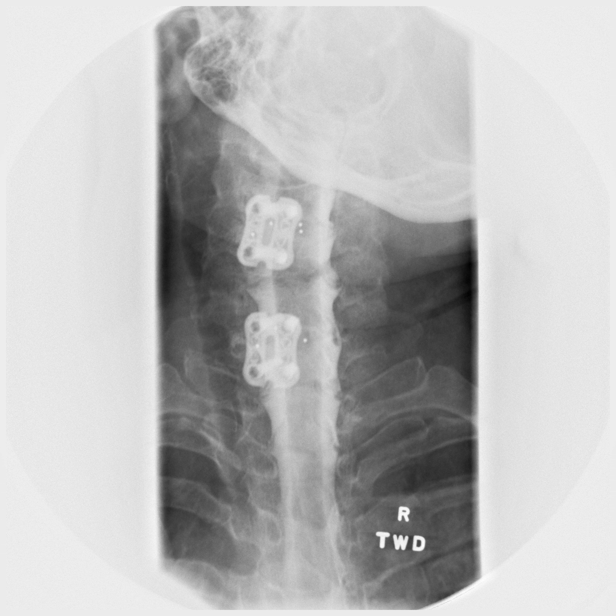

[12 of 19 positions shown; findings below may reference images not displayed]

C t neck 05/07/2010.  Intraoperative lateral view of the lumbar
spine 05/16/2009.
FINDINGS: Prior fusion C4-5 and C6-7.  Mild spinal stenosis C3-4
and C5-6.
IMPRESSION: Prior fusion C4-5 and C6-7.

Mild spinal stenosis C3-4 and C5-6.

No specific nerve root compression.

CT MYELOGRAPHY CERVICAL SPINE
FINDINGS: Incidentally noticed is a torus palatine.  Lucency along
the apex of the left premolar dental work.

Mild atherosclerotic type changes carotid bifurcation bilaterally.

Cervical medullary junction unremarkable.

C2-3:  Mild bilateral facet joint degenerative changes.  Minimal
anterior slip of C2.  Minimal bulge.  No significant spinal
stenosis or foraminal narrowing.

C3-4:  Minimal facet joint degenerative changes.  Mild bulge.  Mild
spinal stenosis.  Minimal bilateral foraminal narrowing.

C4-5:  Prior fusion.  No complication noted.  Minimal bilateral
foraminal narrowing.

C5-6:  Minimal facet joint degenerative changes.  Minimal bulge
slightly greater to the right.  No significant spinal stenosis or
foraminal narrowing.

C6-7:  Prior fusion.  No complication noted.  No spinal stenosis or
foraminal narrowing.

C7-T1:  Negative.

T1-2:  Negative.

T2-3:  Negative.
IMPRESSION: Prior fusion C4-5 and C6-7 without complication noted.

C3-4 mild bulge.  Mild spinal stenosis.  Minimal bilateral
foraminal narrowing.

C5-6 minimal bulge slightly greater to the right.  No significant
spinal stenosis or foraminal narrowing.

MYELOGRAM LUMBAR
FINDINGS: Prior fusion L5-S1.

In extension there is minimal ventral impression upon the thecal
sac at the L2-3 and.  L3-4 level with mild ventral impression upon
the thecal sac at the L4-5 level.

No abnormal motion occurs between flexion and extension.

L4-5 mild disc space narrowing.  Mild circumferential narrowing at
the L4-5 level with mild encroachment upon the upper aspect of the
L5 nerve roots bilaterally.
IMPRESSION: Mild circumferential narrowing at the L4-5 level with mild
encroachment upon the upper aspect of the L5 nerve roots
bilaterally.

Prior fusion L5-S1.

No abnormal motion between flexion and extension.

CT MYELOGRAPHY LUMBAR SPINE
FINDINGS: Last fully open disc space is labeled L5-S1.  Present
examination incorporates from T11-12 disc space through the lower
sacrum.  Conus L2.

Atherosclerotic type changes of the aorta and iliac arteries.

T11-12 through L1-2 unremarkable.

L2-3:  Minimal bulge most notable right foraminal position touching
but not causing significant compression of the exiting right L2
nerve root.

L3-4:  Bulge slightly greater left posterior lateral position.
Mild facet joint degenerative changes and ligamentum flavum
hypertrophy. Very mild left-sided stenosis/lateral recess stenosis.

L4-5:  Mild bilateral facet joint degenerative changes.  Mild
ligamentum flavum hypertrophy. Mild to moderate bulge.  Very mild
spinal stenosis and mild bilateral lateral recess stenosis.  Mild
to slightly moderate bilateral foraminal narrowing. Bulge is
slightly greater left lateral position touching the exiting left L4
nerve root.

L5-S1:  Prior laminectomy and fusion with pedicle screws and
posterior connecting bar.  Small areas of partial fusion across the
disc space and facet joints. Streak artifact from metallic
structures.  Small bony spur extends into the neural foramen with
very mild bilateral foraminal narrowing.  No significant spinal
stenosis.  Postop granulation tissue surrounds the upper left S1
nerve root.
IMPRESSION: L2-3 minimal bulge most notable right foraminal position touching
but not causing significant compression of the exiting right L2
nerve root.

L3-4 very mild left-sided spinal stenosis/lateral recess stenosis.

L4-5 mild to moderate bulge.  Very mild spinal stenosis and mild
bilateral lateral recess stenosis.  Mild to slightly moderate
bilateral foraminal narrowing. Bulge is slightly greater left
lateral position touching the exiting left L4 nerve root.

L5-S1 prior laminectomy and fusion as detailed above.  Small bony
spur extends into the neural foramen with very mild bilateral
foraminal narrowing.  No significant spinal stenosis.  Postop
granulation tissue surrounds the upper left S1 nerve root.

## 2013-01-12 IMAGING — CT CT CERVICAL SPINE W/ CM
3 of 6 series · 12 of 30 positions shown, 13 images · non-contrast
Comparison: Intraoperative cervical spine lateral view 01/19/2010.

CLINICAL DATA: Low back pain.  Bilateral hip pain.  Leg pain
greater on the right.  Numbness left foot.  Mid posterior neck
pain.  Headaches.  Right shoulder and upper back tingling.

CERVICAL AND LUMBAR MYELOGRAM AND POST MYELOGRAM CT
MYELOGRAM CERVICAL
TECHNIQUE: Contrast was injected by Dr.Pinna the L3-4  level.
Contrast was negotiated into the cervical spine without difficulty.
Fluoroscopy time:  0.56 minutes
TECHNIQUE: Multidetector CT imaging of the cervical spine was
performed following myelography.  Multiplanar CT image
reconstructions were also generated.
TECHNIQUE: Multidetector CT imaging of the lumbar spine was

[Series 2: cervical spine · axial · 0.27mm/px · z∈[-201,-103]mm · 3 of 79 slices shown, 4 images]
[im 20/79  soft-tissue]
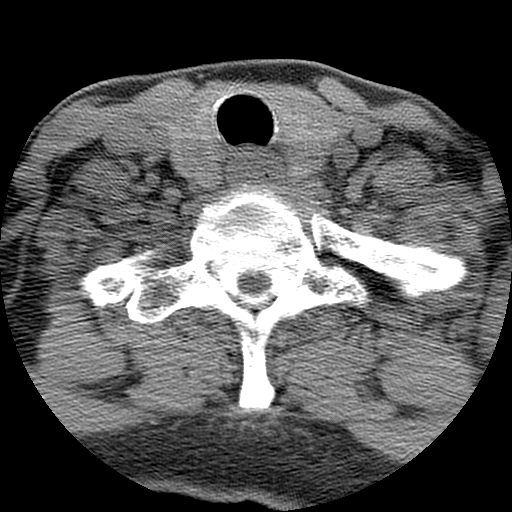
[im 20/79  bone]
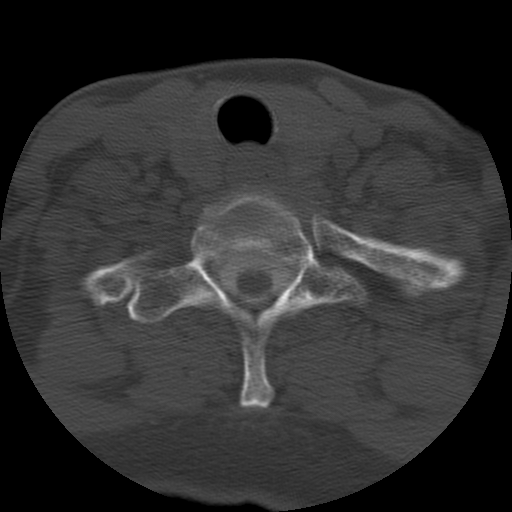
[im 40/79  bone]
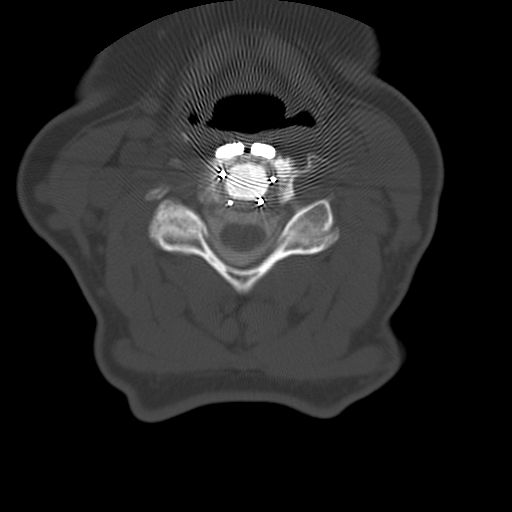
[im 59/79  bone]
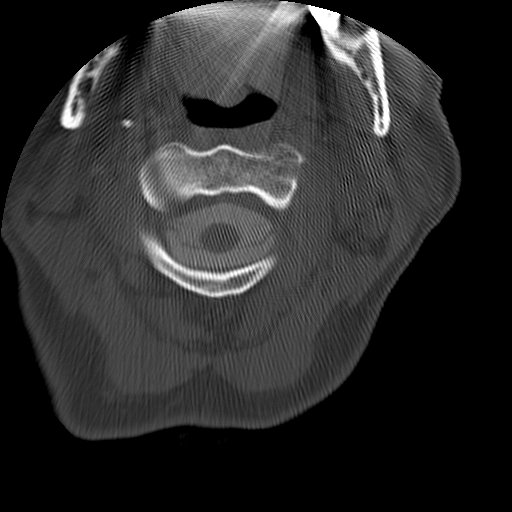

[Series 3: recon 2: cervical spine · axial · 0.27mm/px · z∈[-196,-101]mm · 3 of 77 slices shown]
[im 20/77  bone]
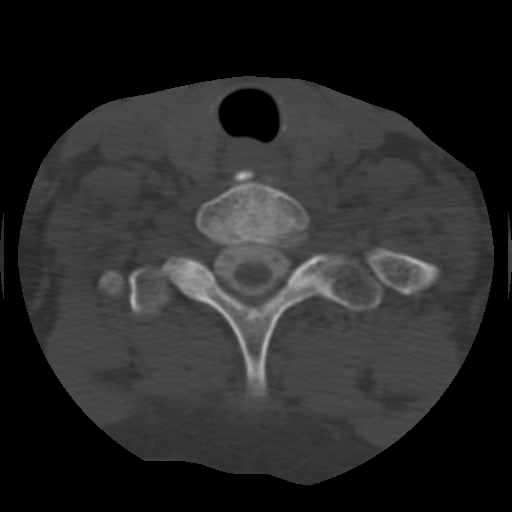
[im 39/77  bone]
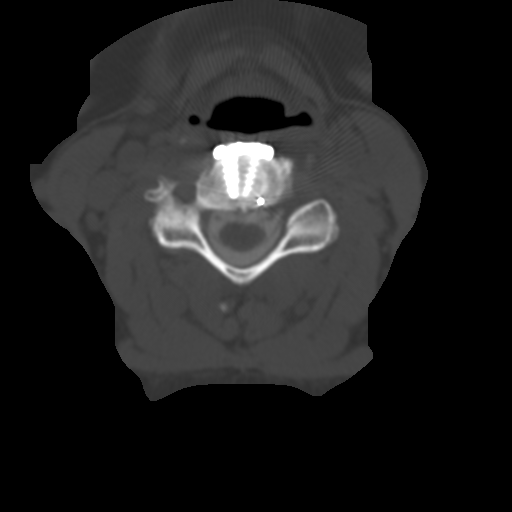
[im 58/77  bone]
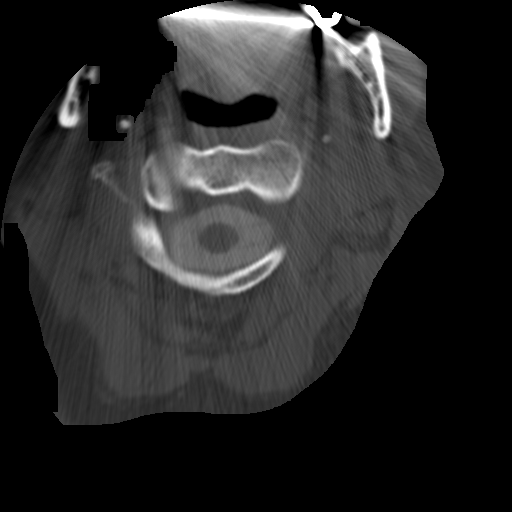

[Series 104: st cor · coronal · 0.39mm/px · 6 of 36 slices shown]
[im 6/36  bone]
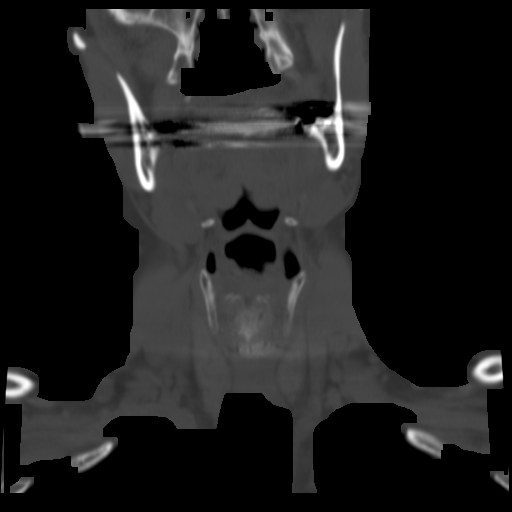
[im 9/36  soft-tissue]
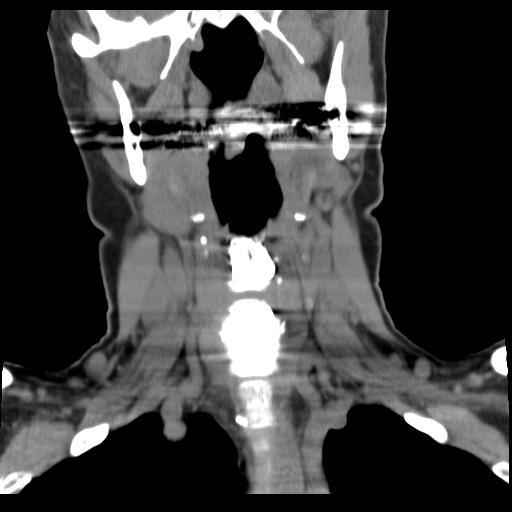
[im 12/36  bone]
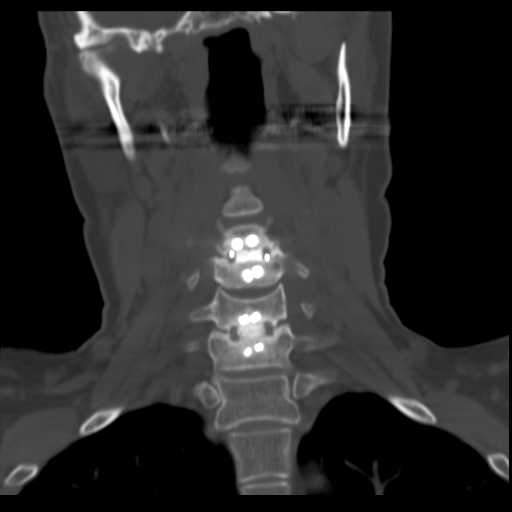
[im 18/36  bone]
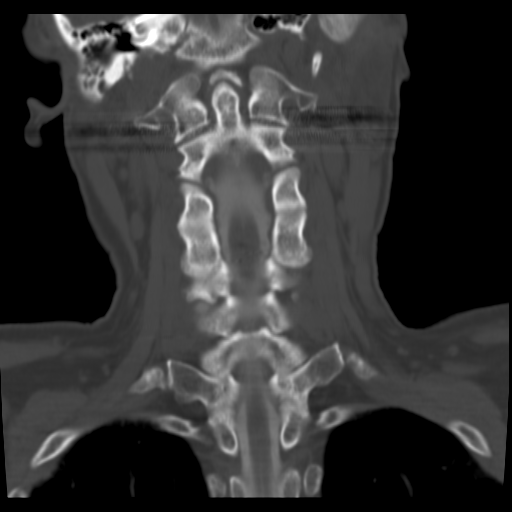
[im 24/36  bone]
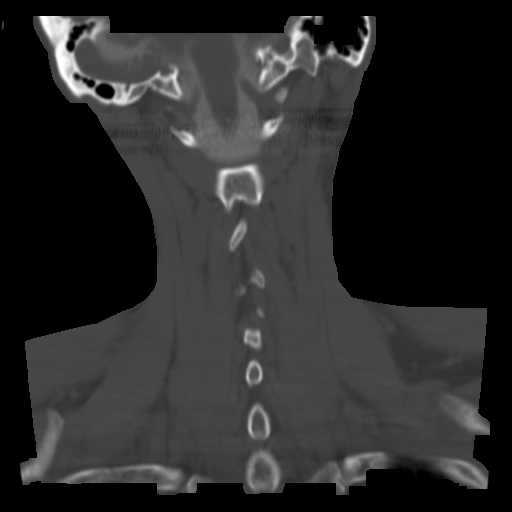
[im 30/36  bone]
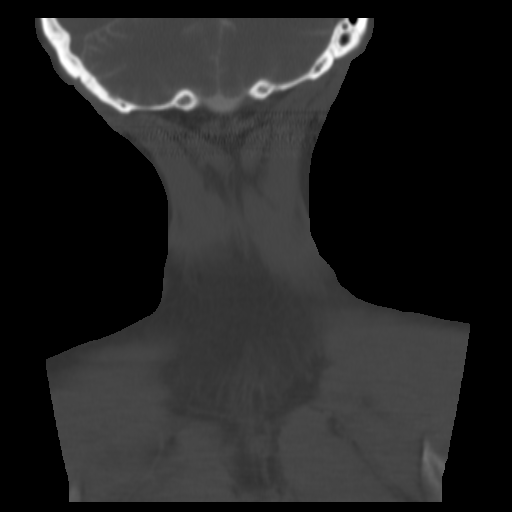

[12 of 30 positions shown; findings below may reference images not displayed]

C t neck 05/07/2010.  Intraoperative lateral view of the lumbar
spine 05/16/2009.
FINDINGS: Prior fusion C4-5 and C6-7.  Mild spinal stenosis C3-4
and C5-6.
IMPRESSION: Prior fusion C4-5 and C6-7.

Mild spinal stenosis C3-4 and C5-6.

No specific nerve root compression.

CT MYELOGRAPHY CERVICAL SPINE
FINDINGS: Incidentally noticed is a torus palatine.  Lucency along
the apex of the left premolar dental work.

Mild atherosclerotic type changes carotid bifurcation bilaterally.

Cervical medullary junction unremarkable.

C2-3:  Mild bilateral facet joint degenerative changes.  Minimal
anterior slip of C2.  Minimal bulge.  No significant spinal
stenosis or foraminal narrowing.

C3-4:  Minimal facet joint degenerative changes.  Mild bulge.  Mild
spinal stenosis.  Minimal bilateral foraminal narrowing.

C4-5:  Prior fusion.  No complication noted.  Minimal bilateral
foraminal narrowing.

C5-6:  Minimal facet joint degenerative changes.  Minimal bulge
slightly greater to the right.  No significant spinal stenosis or
foraminal narrowing.

C6-7:  Prior fusion.  No complication noted.  No spinal stenosis or
foraminal narrowing.

C7-T1:  Negative.

T1-2:  Negative.

T2-3:  Negative.
IMPRESSION: Prior fusion C4-5 and C6-7 without complication noted.

C3-4 mild bulge.  Mild spinal stenosis.  Minimal bilateral
foraminal narrowing.

C5-6 minimal bulge slightly greater to the right.  No significant
spinal stenosis or foraminal narrowing.

MYELOGRAM LUMBAR
FINDINGS: Prior fusion L5-S1.

In extension there is minimal ventral impression upon the thecal
sac at the L2-3 and.  L3-4 level with mild ventral impression upon
the thecal sac at the L4-5 level.

No abnormal motion occurs between flexion and extension.

L4-5 mild disc space narrowing.  Mild circumferential narrowing at
the L4-5 level with mild encroachment upon the upper aspect of the
L5 nerve roots bilaterally.
IMPRESSION: Mild circumferential narrowing at the L4-5 level with mild
encroachment upon the upper aspect of the L5 nerve roots
bilaterally.

Prior fusion L5-S1.

No abnormal motion between flexion and extension.

CT MYELOGRAPHY LUMBAR SPINE
FINDINGS: Last fully open disc space is labeled L5-S1.  Present
examination incorporates from T11-12 disc space through the lower
sacrum.  Conus L2.

Atherosclerotic type changes of the aorta and iliac arteries.

T11-12 through L1-2 unremarkable.

L2-3:  Minimal bulge most notable right foraminal position touching
but not causing significant compression of the exiting right L2
nerve root.

L3-4:  Bulge slightly greater left posterior lateral position.
Mild facet joint degenerative changes and ligamentum flavum
hypertrophy. Very mild left-sided stenosis/lateral recess stenosis.

L4-5:  Mild bilateral facet joint degenerative changes.  Mild
ligamentum flavum hypertrophy. Mild to moderate bulge.  Very mild
spinal stenosis and mild bilateral lateral recess stenosis.  Mild
to slightly moderate bilateral foraminal narrowing. Bulge is
slightly greater left lateral position touching the exiting left L4
nerve root.

L5-S1:  Prior laminectomy and fusion with pedicle screws and
posterior connecting bar.  Small areas of partial fusion across the
disc space and facet joints. Streak artifact from metallic
structures.  Small bony spur extends into the neural foramen with
very mild bilateral foraminal narrowing.  No significant spinal
stenosis.  Postop granulation tissue surrounds the upper left S1
nerve root.
IMPRESSION: L2-3 minimal bulge most notable right foraminal position touching
but not causing significant compression of the exiting right L2
nerve root.

L3-4 very mild left-sided spinal stenosis/lateral recess stenosis.

L4-5 mild to moderate bulge.  Very mild spinal stenosis and mild
bilateral lateral recess stenosis.  Mild to slightly moderate
bilateral foraminal narrowing. Bulge is slightly greater left
lateral position touching the exiting left L4 nerve root.

L5-S1 prior laminectomy and fusion as detailed above.  Small bony
spur extends into the neural foramen with very mild bilateral
foraminal narrowing.  No significant spinal stenosis.  Postop
granulation tissue surrounds the upper left S1 nerve root.

## 2013-02-13 LAB — GLUCOSE (CC13)
Creat: 0.62
Glucose: 94

## 2013-02-24 ENCOUNTER — Encounter: Payer: Self-pay | Admitting: Family Medicine

## 2013-04-02 ENCOUNTER — Encounter: Payer: Self-pay | Admitting: Family Medicine

## 2013-04-02 ENCOUNTER — Ambulatory Visit (INDEPENDENT_AMBULATORY_CARE_PROVIDER_SITE_OTHER): Payer: 59 | Admitting: Family Medicine

## 2013-04-02 VITALS — BP 124/78 | HR 71 | Temp 97.8°F | Wt 144.2 lb

## 2013-04-02 DIAGNOSIS — M549 Dorsalgia, unspecified: Secondary | ICD-10-CM

## 2013-04-02 DIAGNOSIS — F329 Major depressive disorder, single episode, unspecified: Secondary | ICD-10-CM

## 2013-04-02 DIAGNOSIS — E7800 Pure hypercholesterolemia, unspecified: Secondary | ICD-10-CM

## 2013-04-02 MED ORDER — PAROXETINE HCL 40 MG PO TABS: 60.0000 mg | ORAL_TABLET | ORAL | Status: AC

## 2013-04-02 MED ORDER — PRAVASTATIN SODIUM 20 MG PO TABS: 20.0000 mg | ORAL_TABLET | Freq: Every day | ORAL | Status: AC

## 2013-04-02 MED ORDER — TRAZODONE HCL 150 MG PO TABS: 150.0000 mg | ORAL_TABLET | Freq: Every day | ORAL | Status: DC

## 2013-04-02 NOTE — Progress Notes (Signed)
Back pain. Injected last week. Reports from recent MRIs reviewed.  She still has 6-7/10 pain most of the time. Still with pain radiating into the legs, R>L "like a red hot poker."  Pain with bending over.  She can't cook dinner w/o stopping to sit down.  She continues to have pain that limits activity and ADLs.  Sleep disrupted from pain.   Elevated Cholesterol: Using medications without problems:yes Muscle aches: from her back, not from statin Diet compliance: discussed, she's trying to work on this Exercise: limited  Labs discussed.    Depression.  Family stress continues.  Complaint with meds.  Insomnia improved with trazodone.  No ADE from meds.  She gets understandably frustrated from her back pain but is trying to work through this.    Meds, vitals, and allergies reviewed.   ROS: See HPI.  Otherwise, noncontributory.  nad but uncomfortable Neck with pain on ROM in all directions rrr ctab abd soft, normal BS Ext w/o edema Gait slow due to pain.

## 2013-04-02 NOTE — Patient Instructions (Addendum)
Don't change your meds for now.  I hope you notice some improvement from the injection soon. Take care.

## 2013-04-03 NOTE — Assessment & Plan Note (Addendum)
Reasonable control, continue current meds.  Diet discussed.  Exercise limited. >25 min spent with face to face with patient, >50% counseling and/or coordinating care

## 2013-04-03 NOTE — Assessment & Plan Note (Signed)
Reasonable control, accomodation with SSRI, continue.  Okay for outpatient f/u.

## 2013-04-03 NOTE — Assessment & Plan Note (Signed)
Per pain clinic 

## 2013-04-24 ENCOUNTER — Other Ambulatory Visit: Payer: Self-pay | Admitting: Family Medicine

## 2013-05-21 ENCOUNTER — Telehealth: Payer: Self-pay

## 2013-05-21 NOTE — Telephone Encounter (Signed)
Myra with [employer] request status of med records request sent on 05/05/13. Advised could not find request in Health port book; Hollie Salk will refax med records request.

## 2013-11-02 ENCOUNTER — Telehealth: Payer: Self-pay

## 2013-11-02 NOTE — Telephone Encounter (Signed)
[place] has questions about medical records received; [place] will fax questions to [phone].

## 2013-11-04 ENCOUNTER — Telehealth: Payer: Self-pay

## 2013-11-04 NOTE — Telephone Encounter (Signed)
Dr Para March did referral to cardiologist, Dr Merrilee Seashore in [place] last year and pt did not go; last week pt had bad cramping in chest and difficulty breathing; For last 3 days pt feels heavy thumping in chest like an anxiety attack but pt is not having an anxiety attack.also having SOB on and off this morning but no chest pain.No distress now just feels uncomfortable and wants to see cardiologist. Dr Phill Myron phone # [phone]. Pt contacted Dr Bobbye Morton office but was told needed new referral.

## 2013-11-04 NOTE — Telephone Encounter (Signed)
Dr Para March said since pt is having symptoms now with SOB needs to call 911 and be seen at ED. Pt voices understanding and will go not to ED. Pt will cb if needs cardiology referral.

## 2013-11-04 NOTE — Telephone Encounter (Signed)
Advised ER.

## 2013-11-05 NOTE — Telephone Encounter (Signed)
Pt called back and left v/m; pt did go to ED and pt was advised could be anxiety or stress related. Pt requested cb. I called pt back and spoke with her husband and left message for pt to cb.

## 2013-11-05 NOTE — Telephone Encounter (Signed)
Patient advised. She says she will see a clinic near her location.

## 2013-11-05 NOTE — Telephone Encounter (Signed)
I put in the referral.  She needs to be seen about the anxiety and CP before we change her meds.  I would see about getting checked close to home in the near future.  I wouldn't drive this distance with the chest symptoms she has had.

## 2013-11-05 NOTE — Telephone Encounter (Addendum)
Pt called back and pt has been under a lot of stress and has felt anxious. Pt wants to know if med could be given for panic attacks or anxiety or could paxil be increased. Pt said when she wakes up has knot in stomach and that subsides as day goes on but still has fluttering in chest. Pt said not having any difficulty breathing now. Pt does want to proceed with cardiology referral also.[facility] [place]. Pt request cb.

## 2013-12-22 ENCOUNTER — Telehealth: Payer: Self-pay | Admitting: *Deleted

## 2013-12-22 NOTE — Telephone Encounter (Signed)
Patient called to let you know that she has decided to go ahead and find a doctor closer to where she lives now. Patient stated that you may received something from [employer] like you received last year to complete. Advised patient that you will have to put on it that she is changing doctors. Patient stated that if you have a chance she would like to speak to you by phone to say thank you for taking such good care of her.

## 2013-12-23 NOTE — Telephone Encounter (Signed)
I called and LMOVM for patient.  I thanked her for the call.  Will sign off.

## 2014-02-22 ENCOUNTER — Other Ambulatory Visit: Payer: Medicare Other

## 2014-03-01 ENCOUNTER — Encounter: Payer: Medicare Other | Admitting: Family Medicine

## 2014-03-20 ENCOUNTER — Other Ambulatory Visit: Payer: Self-pay | Admitting: Family Medicine

## 2014-03-22 NOTE — Telephone Encounter (Signed)
Sent, she was going to transfer to new MD. This was a short term rx (ie no refills).  Will defer to patient. Thanks.

## 2014-03-22 NOTE — Telephone Encounter (Signed)
Electronic refill request.  Last seen:  03/23/13.  Last filled:  90 tablet 3RF on 04/02/2013.  Please advise.

## 2014-04-07 ENCOUNTER — Other Ambulatory Visit: Payer: Self-pay | Admitting: Family Medicine

## 2014-04-07 NOTE — Telephone Encounter (Signed)
Received refill request electronically. Last office visit 04/02/13. See previous notes; patient changing doctor.

## 2014-04-07 NOTE — Telephone Encounter (Signed)
Denied, was to see another MD in the meantime.

## 2014-05-01 ENCOUNTER — Other Ambulatory Visit: Payer: Self-pay | Admitting: Family Medicine

## 2014-05-03 NOTE — Telephone Encounter (Signed)
Electronic refill request. Patient last seen 04/02/13.  No upcoming appts scheduled.  Last Filled:   90 tablet 3 RF on    04/02/2013.

## 2014-05-03 NOTE — Telephone Encounter (Signed)
Deny, she was to change to another MD.

## 2014-05-23 ENCOUNTER — Other Ambulatory Visit: Payer: Self-pay | Admitting: Family Medicine

## 2014-07-13 ENCOUNTER — Other Ambulatory Visit: Payer: Self-pay | Admitting: Family Medicine

## 2014-07-13 NOTE — Telephone Encounter (Signed)
Electronic refill request. Last Filled:   135 tablet 3 RF on   04/02/2013.  Patient has not been seen in greater than 1 year with no upcoming appt scheduled.  According to refill protocol, please advise.

## 2014-07-14 NOTE — Telephone Encounter (Signed)
Decline.  She had transferred care to MD in [place].  Thanks.

## 2019-05-18 ENCOUNTER — Encounter: Attending: Registered Nurse | Primary: Family Medicine

## 2019-08-11 ENCOUNTER — Encounter

## 2019-08-11 MED ORDER — CLONAZEPAM 0.5 MG TAB
0.5 mg | ORAL_TABLET | ORAL | 2 refills | Status: DC
Start: 2019-08-11 — End: 2019-10-26

## 2019-08-11 NOTE — Telephone Encounter (Signed)
Klonopin refill request.

## 2019-08-24 MED ORDER — PAROXETINE 40 MG TAB
40 mg | ORAL_TABLET | ORAL | 1 refills | Status: DC
Start: 2019-08-24 — End: 2020-03-07

## 2019-08-26 ENCOUNTER — Encounter: Attending: Registered Nurse | Primary: Family Medicine

## 2019-10-25 ENCOUNTER — Encounter

## 2019-10-26 MED ORDER — CLONAZEPAM 0.5 MG TAB
0.5 mg | ORAL_TABLET | ORAL | 0 refills | Status: DC
Start: 2019-10-26 — End: 2019-12-02

## 2019-10-26 NOTE — Telephone Encounter (Signed)
Klonopin refill request.

## 2019-11-24 ENCOUNTER — Encounter

## 2019-11-24 MED ORDER — ARIPIPRAZOLE 5 MG TAB
5 mg | ORAL_TABLET | Freq: Two times a day (BID) | ORAL | 0 refills | Status: DC
Start: 2019-11-24 — End: 2019-12-02

## 2019-12-02 ENCOUNTER — Telehealth: Attending: Registered Nurse | Primary: Family Medicine

## 2019-12-02 ENCOUNTER — Telehealth
Admit: 2019-12-02 | Discharge: 2019-12-02 | Payer: BLUE CROSS/BLUE SHIELD | Attending: Registered Nurse | Primary: Family Medicine

## 2019-12-02 DIAGNOSIS — F411 Generalized anxiety disorder: Secondary | ICD-10-CM

## 2019-12-02 MED ORDER — ARIPIPRAZOLE 5 MG TAB
5 mg | ORAL_TABLET | Freq: Two times a day (BID) | ORAL | 0 refills | Status: DC
Start: 2019-12-02 — End: 2020-02-03

## 2019-12-02 MED ORDER — CLONAZEPAM 0.5 MG TAB
0.5 mg | ORAL_TABLET | Freq: Three times a day (TID) | ORAL | 1 refills | Status: DC | PRN
Start: 2019-12-02 — End: 2020-02-03

## 2019-12-02 NOTE — Progress Notes (Signed)
Admire Bunnell Linhares is a 64 y.o. female who presents today for the following:  Chief Complaint   Patient presents with   ??? Medication Management     "I've been sick for the past week."   ??? Follow-up   ??? Depression   ??? Anxiety     Grover Canavan Martinek, who was evaluated through a synchronous (real-time) audio telephone encounter, and/or her healthcare decision maker, is aware that it is a billable service, with coverage as determined by her insurance carrier. She provided verbal consent to proceed: YES Consent obtained within past 12 months:Yes, and patient identification was verified. It was conducted pursuant to the emergency declaration under the [employer] and the [employer], [employer] waiver authority and the [employer] and [employer] Act. A caregiver was present when appropriate. Ability to conduct physical exam was limited. I was home. The patient was home.     Length of telephone visit 9 minutes and 15 seconds.              No Known Allergies    Current Outpatient Medications   Medication Sig   ??? ARIPiprazole (ABILIFY) 5 mg tablet Take 0.5 Tabs by mouth two (2) times a day for 90 days.   ??? clonazePAM (KlonoPIN) 0.5 mg tablet Take 1 Tab by mouth three (3) times daily as needed for Anxiety for up to 30 days. Max Daily Amount: 1.5 mg.   ??? PARoxetine (PAXIL) 40 mg tablet TAKE 1 TABLET DAILY     No current facility-administered medications for this visit.        Past Medical History:   Diagnosis Date   ??? Anxiety    ??? Arthritis    ??? Bronchitis    ??? Depression    ??? GERD (gastroesophageal reflux disease)    ??? High cholesterol    ??? Migraines        History reviewed. No pertinent surgical history.    Family History   Problem Relation Age of Onset   ??? Cancer Father        Social History     Socioeconomic History   ??? Marital status: MARRIED     Spouse name: Not on file   ??? Number of children: 0   ??? Years of education: Not on file   ??? Highest education level: Master's degree  (e.g., MA, MS, MEng, MEd, MSW, MBA)   Occupational History   ??? Not on file   Social Needs   ??? Financial resource strain: Not hard at all   ??? Food insecurity     Worry: Never true     Inability: Never true   ??? Transportation needs     Medical: No     Non-medical: No   Tobacco Use   ??? Smoking status: Current Every Day Smoker     Packs/day: 0.50   ??? Smokeless tobacco: Former Neurosurgeon   Substance and Sexual Activity   ??? Alcohol use: Not Currently   ??? Drug use: Not Currently     Types: Marijuana     Comment: hx of alcohol and marijuana use.   ??? Sexual activity: Not on file   Lifestyle   ??? Physical activity     Days per week: Not on file     Minutes per session: Not on file   ??? Stress: Not on file   Relationships   ??? Social Wellsite geologist on phone: Not on file  Gets together: Not on file     Attends religious service: Not on file     Active member of club or organization: Not on file     Attends meetings of clubs or organizations: Not on file     Relationship status: Not on file   ??? Intimate partner violence     Fear of current or ex partner: Not on file     Emotionally abused: Not on file     Physically abused: Not on file     Forced sexual activity: Not on file   Other Topics Concern   ??? Not on file   Social History Narrative   ??? Not on file         Mrs. Zukowski is unable to do virtual visit, telephone visit required. She follows up in the clinic for anxiety disorder and recurrent major depression.  Patient last visited the clinic January 27, 2019.  Current psychotropic medications-Abilify 5 mg tablet take half tablet twice daily, Paxil 40 mg tablet take 1 tablet daily, Cymbalta 60 mg capsule take 1 capsule daily and Klonopin 0.5 mg tablet take 1 tablet 3 times daily as needed for anxiety.    On today's visit, patient reports that she has been sick with a stomach virus or the past week.  She denies feeling depressed or anxious.  No suicidal homicidal thinking noted, risk for suicide is low, protective factor-family.   She reports stable appetite and fluctuating sleep which she relates sleep issues to being out of Abilify.  Patient reports overall her mood has been stable.  Patient reports that she ran out of Abilify around Christmas and had some trouble getting it filled due to insurance issues.  On today's visit, she is requesting Abilify and Klonopin refills to be sent to [facility] in [place].  She denies any medication side effects.  No psychotic symptoms noted and she denies on direct questioning.  Patient lives with her husband in a stable and supportive home environment.  She smokes half pack of cigarettes daily and denies alcohol and drug use.  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        Review of Systems   Gastrointestinal: Positive for diarrhea.   Psychiatric/Behavioral: The patient has insomnia.    All other systems reviewed and are negative.  Patient denies any other physical issues.       There were no vitals taken for this visit.  Physical Exam  Psychiatric:         Attention and Perception: Attention and perception normal.         Mood and Affect: Mood normal.         Speech: Speech normal.         Behavior: Behavior normal. Behavior is cooperative.         Thought Content: Thought content normal.         Cognition and Memory: Cognition and memory normal.         Judgment: Judgment normal.          Plan:    Continue the above medications as prescribed.  Continue to follow-up with Dr. Prince Rome for primary care.  Advised patient to avoid smoking, alcohol and drug use.  Advised patient to call 911 or go to the emergency department for suicidal homicidal thinking.    There are no diagnoses linked to this encounter.

## 2019-12-02 NOTE — Progress Notes (Signed)
Jodi Calhoun is a 64 y.o. female who presents today for the following:  Chief Complaint   Patient presents with   ??? Medication Management     "I've been sick for the past week."   ??? Follow-up   ??? Depression   ??? Jodi Calhoun, who was evaluated through a synchronous (real-time) audio telephone encounter, and/or her healthcare decision maker, is aware that it is a billable service, with coverage as determined by her insurance carrier. She provided verbal consent to proceed: YES Consent obtained within past 12 months:Yes, and patient identification was verified. It was conducted pursuant to the emergency declaration under the [place], [place] waiver authority and the [employer] Act. A caregiver was present when appropriate. Ability to conduct physical exam was limited. I was home. The patient was home.     Length of telephone visit 9 minutes and 15 seconds.              No Known Allergies    Current Outpatient Medications   Medication Sig   ??? ARIPiprazole (ABILIFY) 5 mg tablet Take 0.5 Tabs by mouth two (2) times a day for 90 days.   ??? clonazePAM (KlonoPIN) 0.5 mg tablet Take 1 Tab by mouth three (3) times daily as needed for Anxiety for up to 30 days. Max Daily Amount: 1.5 mg.   ??? PARoxetine (PAXIL) 40 mg tablet TAKE 1 TABLET DAILY     No current facility-administered medications for this visit.        Past Medical History:   Diagnosis Date   ??? Anxiety    ??? Arthritis    ??? Bronchitis    ??? Depression    ??? GERD (gastroesophageal reflux disease)    ??? High cholesterol    ??? Migraines        History reviewed. No pertinent surgical history.    Family History   Problem Relation Age of Onset   ??? Cancer Father        Social History     Socioeconomic History   ??? Marital status: MARRIED     Spouse name: Not on file   ??? Number of children: 0   ??? Years of education: Not on file    ??? Highest education level: Master's degree (e.g., MA, MS, MEng, MEd, MSW, MBA)   Occupational History   ??? Not on file   Social Needs   ??? Financial resource strain: Not hard at all   ??? Food insecurity     Worry: Never true     Inability: Never true   ??? Transportation needs     Medical: No     Non-medical: No   Tobacco Use   ??? Smoking status: Current Every Day Smoker     Packs/day: 0.50   ??? Smokeless tobacco: Former Systems developer   Substance and Sexual Activity   ??? Alcohol use: Not Currently   ??? Drug use: Not Currently     Types: Marijuana     Comment: hx of alcohol and marijuana use.   ??? Sexual activity: Not on file   Lifestyle   ??? Physical activity     Days per week: Not on file     Minutes per session: Not on file   ??? Stress: Not on file   Relationships   ??? Social Product manager on phone: Not on file  Gets together: Not on file     Attends religious service: Not on file     Active member of club or organization: Not on file     Attends meetings of clubs or organizations: Not on file     Relationship status: Not on file   ??? Intimate partner violence     Fear of current or ex partner: Not on file     Emotionally abused: Not on file     Physically abused: Not on file     Forced sexual activity: Not on file   Other Topics Concern   ??? Not on file   Social History Narrative   ??? Not on file         Jodi Calhoun is unable to do virtual visit, telephone visit required. She follows up in the clinic for anxiety disorder and recurrent major depression.  Patient last visited the clinic January 27, 2019.  Current psychotropic medications-Abilify 5 mg tablet take half tablet twice daily, Paxil 40 mg tablet take 1 tablet daily, Cymbalta 60 mg capsule take 1 capsule daily and Klonopin 0.5 mg tablet take 1 tablet 3 times daily as needed for anxiety.     On today's visit, patient reports that she has been sick with a stomach virus or the past week.  She denies feeling depressed or anxious.  No suicidal homicidal thinking noted, risk for suicide is low, protective factor-family.  She reports stable appetite and fluctuating sleep which she relates sleep issues to being out of Abilify.  Patient reports overall her mood has been stable.  Patient reports that she ran out of Abilify around Christmas and had some trouble getting it filled due to insurance issues.  On today's visit, she is requesting Abilify and Klonopin refills to be sent to [facility] pharmacy in [place].  She denies any medication side effects.  No psychotic symptoms noted and she denies on direct questioning.  Patient lives with her husband in a stable and supportive home environment.  She smokes half pack of cigarettes daily and denies alcohol and drug use.  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        Review of Systems   Gastrointestinal: Positive for diarrhea.   Psychiatric/Behavioral: The patient has insomnia.    All other systems reviewed and are negative.  Patient denies any other physical issues.       There were no vitals taken for this visit.  Physical Exam  Psychiatric:         Attention and Perception: Attention and perception normal.         Mood and Affect: Mood normal.         Speech: Speech normal.         Behavior: Behavior normal. Behavior is cooperative.         Thought Content: Thought content normal.         Cognition and Memory: Cognition and memory normal.         Judgment: Judgment normal.          Plan:    Continue the above medications as prescribed.  Continue to follow-up with Dr. Lorenso Courier for primary care.  Advised patient to avoid smoking, alcohol and drug use.  Advised patient to call 911 or go to the emergency department for suicidal homicidal thinking.    There are no diagnoses linked to this encounter.

## 2020-02-03 ENCOUNTER — Encounter

## 2020-02-03 MED ORDER — CLONAZEPAM 0.5 MG TAB
0.5 mg | ORAL_TABLET | ORAL | 0 refills | Status: DC
Start: 2020-02-03 — End: 2020-03-06

## 2020-02-03 MED ORDER — ARIPIPRAZOLE 5 MG TAB
5 mg | ORAL_TABLET | ORAL | 0 refills | Status: DC
Start: 2020-02-03 — End: 2020-03-06

## 2020-02-03 NOTE — Telephone Encounter (Signed)
Klonopin refill request.

## 2020-03-05 ENCOUNTER — Encounter

## 2020-03-06 ENCOUNTER — Encounter

## 2020-03-06 MED ORDER — CLONAZEPAM 0.5 MG TAB
0.5 mg | ORAL_TABLET | ORAL | 0 refills | Status: DC
Start: 2020-03-06 — End: 2020-04-08

## 2020-03-06 MED ORDER — ARIPIPRAZOLE 5 MG TAB
5 mg | ORAL_TABLET | ORAL | 0 refills | Status: DC
Start: 2020-03-06 — End: 2020-07-18

## 2020-03-06 NOTE — Telephone Encounter (Signed)
Klonopin refill request.

## 2020-03-07 MED ORDER — PAROXETINE 40 MG TAB
40 mg | ORAL_TABLET | ORAL | 1 refills | Status: DC
Start: 2020-03-07 — End: 2020-05-24

## 2020-03-07 NOTE — Telephone Encounter (Signed)
Requested Prescriptions     Pending Prescriptions Disp Refills   ??? PARoxetine (PAXIL) 40 mg tablet 90 Tab 1

## 2020-03-21 ENCOUNTER — Encounter: Attending: Registered Nurse | Primary: Family Medicine

## 2020-04-08 ENCOUNTER — Encounter

## 2020-04-08 MED ORDER — CLONAZEPAM 0.5 MG TAB
0.5 mg | ORAL_TABLET | ORAL | 0 refills | Status: DC
Start: 2020-04-08 — End: 2020-05-09

## 2020-04-08 NOTE — Telephone Encounter (Signed)
Klonopin refill request.

## 2020-05-09 ENCOUNTER — Encounter

## 2020-05-09 ENCOUNTER — Encounter: Attending: Registered Nurse | Primary: Family Medicine

## 2020-05-09 MED ORDER — CLONAZEPAM 0.5 MG TAB
0.5 mg | ORAL_TABLET | ORAL | 0 refills | Status: DC
Start: 2020-05-09 — End: 2020-06-16

## 2020-05-09 NOTE — Telephone Encounter (Signed)
Klonopin refill request.

## 2020-05-24 NOTE — Telephone Encounter (Signed)
Requested Prescriptions     Pending Prescriptions Disp Refills   . PARoxetine (PAXIL) 40 mg tablet 90 Tablet 1     Sig: TAKE 1 TABLET DAILY     Patient is requesting a refill on this medication. She said that she been out of the medication for a week or more so she asked if you could send it to [facility].

## 2020-05-25 MED ORDER — PAROXETINE 40 MG TAB
40 mg | ORAL_TABLET | ORAL | 1 refills | Status: DC
Start: 2020-05-25 — End: 2020-05-27

## 2020-05-27 MED ORDER — PAROXETINE 40 MG TAB
40 mg | ORAL_TABLET | ORAL | 1 refills | Status: DC
Start: 2020-05-27 — End: 2020-11-10

## 2020-06-16 ENCOUNTER — Encounter

## 2020-06-16 MED ORDER — CLONAZEPAM 0.5 MG TAB
0.5 mg | ORAL_TABLET | ORAL | 0 refills | Status: DC
Start: 2020-06-16 — End: 2020-07-26

## 2020-06-16 NOTE — Telephone Encounter (Signed)
Klonopin refill request.

## 2020-07-18 ENCOUNTER — Ambulatory Visit: Attending: Registered Nurse | Primary: Family Medicine

## 2020-07-18 ENCOUNTER — Ambulatory Visit
Admit: 2020-07-18 | Discharge: 2020-07-18 | Payer: BLUE CROSS/BLUE SHIELD | Attending: Registered Nurse | Primary: Family Medicine

## 2020-07-18 DIAGNOSIS — F411 Generalized anxiety disorder: Secondary | ICD-10-CM

## 2020-07-18 MED ORDER — DULOXETINE 30 MG CAP, DELAYED RELEASE
30 mg | ORAL_CAPSULE | Freq: Every day | ORAL | 0 refills | Status: DC
Start: 2020-07-18 — End: 2020-08-10

## 2020-07-18 NOTE — Progress Notes (Signed)
Jodi Calhoun is a 64 y.o. female who presents today for the following:  Chief Complaint   Patient presents with   ??? Follow-up     "I wake up with a knot in my stomach, like it's internal nerves."   ??? Anxiety       Allergies   Allergen Reactions   ??? Amoxicillin-Pot Clavulanate Unknown (comments)       Current Outpatient Medications   Medication Sig   ??? DULoxetine (CYMBALTA) 30 mg capsule Take 1 Capsule by mouth daily for 30 days.   ??? clonazePAM (KlonoPIN) 0.5 mg tablet TAKE 1 TABLET BY MOUTH THREE TIMES DAILY AS NEEDED FOR ANXIETY   ??? PARoxetine (PAXIL) 40 mg tablet TAKE 1 TABLET DAILY   ??? DULoxetine (CYMBALTA) 60 mg capsule TAKE 1 CAPSULE BY MOUTH ONCE DAILY   ??? fluticasone-umeclidinium-vilanterol (TRELEGY ELLIPTA) 100-62.5-25 mcg inhaler Trelegy Ellipta 100 mcg-62.5 mcg-25 mcg powder for inhalation   Inhale 1 puff every day by inhalation route.   ??? ondansetron hcl (ZOFRAN) 4 mg tablet TAKE 1 TABLET BY MOUTH EVERY 4 HOURS AS NEEDED FOR NAUSEA AND VOMITING   ??? tiZANidine (ZANAFLEX) 4 mg tablet TAKE 1 TABLET BY MOUTH THREE TIMES DAILY AS NEEDED FOR MUSCLE SPASM     No current facility-administered medications for this visit.       Past Medical History:   Diagnosis Date   ??? Anxiety    ??? Arthritis    ??? Bronchitis    ??? Depression    ??? GERD (gastroesophageal reflux disease)    ??? High cholesterol    ??? Migraines        History reviewed. No pertinent surgical history.    Family History   Problem Relation Age of Onset   ??? Cancer Father        Social History     Socioeconomic History   ??? Marital status: MARRIED     Spouse name: Not on file   ??? Number of children: 0   ??? Years of education: Not on file   ??? Highest education level: Master's degree (e.g., MA, MS, MEng, MEd, MSW, MBA)   Occupational History   ??? Not on file   Tobacco Use   ??? Smoking status: Current Every Day Smoker     Packs/day: 0.50   ??? Smokeless tobacco: Former Airline pilot Use   ??? Vaping Use: Former   Substance and Sexual Activity   ??? Alcohol use: Not  Currently   ??? Drug use: Not Currently     Types: Marijuana     Comment: hx of alcohol and marijuana use.   ??? Sexual activity: Not on file   Other Topics Concern   ??? Not on file   Social History Narrative   ??? Not on file     Social Determinants of Health     Financial Resource Strain: Low Risk    ??? Difficulty of Paying Living Expenses: Not hard at all   Food Insecurity: No Food Insecurity   ??? Worried About Programme researcher, broadcasting/film/video in the Last Year: Never true   ??? Ran Out of Food in the Last Year: Never true   Transportation Needs: No Transportation Needs   ??? Lack of Transportation (Medical): No   ??? Lack of Transportation (Non-Medical): No   Physical Activity:    ??? Days of Exercise per Week:    ??? Minutes of Exercise per Session:    Stress:    ??? Feeling  of Stress :    Social Connections:    ??? Frequency of Communication with Friends and Family:    ??? Frequency of Social Gatherings with Friends and Family:    ??? Attends Religious Services:    ??? Database administrator or Organizations:    ??? Attends Engineer, structural:    ??? Marital Status:    Intimate Programme researcher, broadcasting/film/video Violence:    ??? Fear of Current or Ex-Partner:    ??? Emotionally Abused:    ??? Physically Abused:    ??? Sexually Abused:          Jodi Calhoun follows up in the clinic for anxiety disorder and recurrent major depression.  Patient last visited the clinic December 02, 2019.  She continues Paxil 40 mg take 1 tablet daily, Cymbalta 60 mg take 1 capsule daily and Klonopin 0.5 mg take 1 tablet 3 times daily as needed for anxiety.    Patient presents to the clinic unaccompanied, clean fully alert and oriented.  Gait stable.  Mood is mildly anxious.  She reports feeling anxious for the past 2 months.  She mentions that she wakes up in the morning with a knot in her stomach which she believes is related to her nerves.  She has occasional periods of mild depression but she denies that it interferes with daily living.  Appetite-stable.  It is noted patient reports that she has  lost about 20 pounds which she relates to cutting back on sugar and using Go light supplement.  She reports no issues with energy level.  No psychotic symptoms observed or reported.  Sleep-"good."  She smokes half pack of cigarettes daily and denies alcohol and drug use.  Patient lives at home with her husband in a stable home environment.        Review of Systems   Gastrointestinal: Positive for nausea.   Musculoskeletal: Positive for back pain, joint pain and neck pain.   Neurological: Positive for tremors.   Psychiatric/Behavioral: The patient is nervous/anxious.    All other systems reviewed and are negative.        Visit Vitals  Wt 71.3 kg (157 lb 3.2 oz)     Physical Exam  Psychiatric:         Attention and Perception: Attention and perception normal.         Mood and Affect: Mood is anxious.         Speech: Speech normal.         Behavior: Behavior normal. Behavior is cooperative.         Thought Content: Thought content normal.         Cognition and Memory: Cognition and memory normal.         Judgment: Judgment normal.            Plan:    Patient is requesting referral to [facility] psychiatry because it is 15 minutes away from her home.  Add Cymbalta 30 mg take 1 capsule daily.  Patient may continue 60 mg capsule to equal 90 mg daily dose.  Patient may continue Klonopin and Paxil as prescribed.  For emergencies-call 911 or go to the emergency department.  Advised patient to avoid smoking, alcohol and drug use.  Patient may follow-up with medical provider as appropriate.

## 2020-07-26 ENCOUNTER — Encounter

## 2020-07-26 MED ORDER — CLONAZEPAM 0.5 MG TAB
0.5 mg | ORAL_TABLET | ORAL | 0 refills | Status: AC
Start: 2020-07-26 — End: ?

## 2020-07-26 NOTE — Telephone Encounter (Signed)
Klonopin refill request.

## 2020-08-10 ENCOUNTER — Encounter

## 2020-08-10 MED ORDER — DULOXETINE 30 MG CAP, DELAYED RELEASE
30 mg | ORAL_CAPSULE | Freq: Every day | ORAL | 0 refills | Status: AC
Start: 2020-08-10 — End: 2020-09-09

## 2020-08-10 NOTE — Telephone Encounter (Signed)
 Requested Prescriptions     Pending Prescriptions Disp Refills   . DULoxetine (CYMBALTA) 30 mg capsule 30 Capsule 0     Sig: Take 1 Capsule by mouth daily for 30 days.

## 2020-11-10 MED ORDER — PAROXETINE 40 MG TAB
40 mg | ORAL_TABLET | ORAL | 1 refills | Status: DC
Start: 2020-11-10 — End: 2021-05-11

## 2021-05-11 MED ORDER — PAROXETINE 40 MG TAB
40 mg | ORAL_TABLET | ORAL | 0 refills | Status: AC
Start: 2021-05-11 — End: ?

## 2021-05-11 NOTE — Telephone Encounter (Signed)
Patient reports she is seeing a psychiatrist in [place]. Paxil prescription cancelled.

## 2021-06-13 ENCOUNTER — Encounter: Attending: Registered Nurse | Primary: Family Medicine
